# Patient Record
Sex: Female | Born: 1995 | Race: White | Hispanic: No | Marital: Single | Smoking: Former smoker
Health system: Southern US, Community
[De-identification: ages and names within clinical notes are randomized; demographics above are authoritative.]

## PROBLEM LIST (undated history)

## (undated) ENCOUNTER — Inpatient Hospital Stay (HOSPITAL_COMMUNITY): Payer: Self-pay

## (undated) DIAGNOSIS — Z789 Other specified health status: Secondary | ICD-10-CM

## (undated) HISTORY — PX: TONSILLECTOMY: SUR1361

## (undated) HISTORY — PX: ADENOIDECTOMY: SUR15

---
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
## 2017-06-05 ENCOUNTER — Other Ambulatory Visit: Payer: Self-pay

## 2017-06-05 ENCOUNTER — Emergency Department (HOSPITAL_COMMUNITY)
Admission: EM | Admit: 2017-06-05 | Discharge: 2017-06-05 | Discharge status: Against Medical Advice | Payer: BLUE CROSS/BLUE SHIELD

## 2017-08-17 ENCOUNTER — Emergency Department (HOSPITAL_COMMUNITY)
Admission: EM | Admit: 2017-08-17 | Discharge: 2017-08-18 | Disposition: A | Discharge status: Home / Self Care | Payer: Medicaid Other | Attending: Emergency Medicine | Admitting: Emergency Medicine

## 2017-08-17 ENCOUNTER — Other Ambulatory Visit: Payer: Self-pay

## 2017-08-17 ENCOUNTER — Encounter (HOSPITAL_COMMUNITY): Payer: Self-pay | Admitting: Emergency Medicine

## 2017-08-17 DIAGNOSIS — R110 Nausea: Secondary | ICD-10-CM | POA: Diagnosis not present

## 2017-08-17 DIAGNOSIS — R109 Unspecified abdominal pain: Secondary | ICD-10-CM | POA: Diagnosis present

## 2017-08-17 DIAGNOSIS — Z5321 Procedure and treatment not carried out due to patient leaving prior to being seen by health care provider: Secondary | ICD-10-CM | POA: Diagnosis not present

## 2017-08-17 DIAGNOSIS — R1030 Lower abdominal pain, unspecified: Secondary | ICD-10-CM | POA: Insufficient documentation

## 2017-08-17 NOTE — ED Triage Notes (Signed)
C/o intermittent abd cramping x 1 week with nausea.  Denies vomiting, diarrhea, and constipation.  Last BM today- normal.

## 2017-08-18 ENCOUNTER — Encounter (HOSPITAL_COMMUNITY): Payer: Self-pay | Admitting: Emergency Medicine

## 2017-08-18 ENCOUNTER — Emergency Department (HOSPITAL_COMMUNITY)
Admission: EM | Admit: 2017-08-18 | Discharge: 2017-08-19 | Disposition: A | Discharge status: Home / Self Care | Payer: Medicaid Other | Source: Home / Self Care

## 2017-08-18 DIAGNOSIS — Z5321 Procedure and treatment not carried out due to patient leaving prior to being seen by health care provider: Secondary | ICD-10-CM

## 2017-08-18 DIAGNOSIS — R1030 Lower abdominal pain, unspecified: Secondary | ICD-10-CM

## 2017-08-18 LAB — COMPREHENSIVE METABOLIC PANEL
ALK PHOS: 62 U/L (ref 38–126)
ALT: 32 U/L (ref 14–54)
ANION GAP: 8 (ref 5–15)
AST: 27 U/L (ref 15–41)
Albumin: 3.9 g/dL (ref 3.5–5.0)
BILIRUBIN TOTAL: 0.7 mg/dL (ref 0.3–1.2)
BUN: 8 mg/dL (ref 6–20)
CALCIUM: 9.2 mg/dL (ref 8.9–10.3)
CO2: 24 mmol/L (ref 22–32)
Chloride: 107 mmol/L (ref 101–111)
Creatinine, Ser: 0.8 mg/dL (ref 0.44–1.00)
GLUCOSE: 86 mg/dL (ref 65–99)
Potassium: 3.7 mmol/L (ref 3.5–5.1)
Sodium: 139 mmol/L (ref 135–145)
TOTAL PROTEIN: 6.9 g/dL (ref 6.5–8.1)

## 2017-08-18 LAB — URINALYSIS, ROUTINE W REFLEX MICROSCOPIC
BILIRUBIN URINE: NEGATIVE
Glucose, UA: NEGATIVE mg/dL
Hgb urine dipstick: NEGATIVE
Ketones, ur: NEGATIVE mg/dL
LEUKOCYTES UA: NEGATIVE
NITRITE: NEGATIVE
Protein, ur: NEGATIVE mg/dL
Specific Gravity, Urine: 1.017 (ref 1.005–1.030)
pH: 6 (ref 5.0–8.0)

## 2017-08-18 LAB — CBC
HEMATOCRIT: 43.7 % (ref 36.0–46.0)
Hemoglobin: 14.6 g/dL (ref 12.0–15.0)
MCH: 31.1 pg (ref 26.0–34.0)
MCHC: 33.4 g/dL (ref 30.0–36.0)
MCV: 93.2 fL (ref 78.0–100.0)
Platelets: 284 10*3/uL (ref 150–400)
RBC: 4.69 MIL/uL (ref 3.87–5.11)
RDW: 12.4 % (ref 11.5–15.5)
WBC: 10.1 10*3/uL (ref 4.0–10.5)

## 2017-08-18 LAB — I-STAT BETA HCG BLOOD, ED (MC, WL, AP ONLY): HCG, QUANTITATIVE: 370 m[IU]/mL — AB (ref <5)

## 2017-08-18 LAB — LIPASE, BLOOD: Lipase: 81 U/L — ABNORMAL HIGH (ref 11–51)

## 2017-08-18 NOTE — ED Triage Notes (Signed)
Reports lower abdominal pain for about a week.  Reports as not bad pain but was here last night and did not wait to be seen.  Looked at the app to see blood work and was told she should come back to get an US due to elevated HCG level.

## 2017-08-19 NOTE — ED Notes (Signed)
No response when called for vitals.  

## 2017-08-19 NOTE — ED Notes (Signed)
Still no answer after multiple attempts to recheck vitals.  To remove at this time.

## 2017-08-20 NOTE — ED Notes (Signed)
Follow up call made  No answer  08/20/17  0949 s [place] [employer] rn

## 2017-09-02 ENCOUNTER — Inpatient Hospital Stay (HOSPITAL_COMMUNITY)
Admission: AD | Admit: 2017-09-02 | Discharge: 2017-09-03 | Disposition: A | Discharge status: Home / Self Care | Payer: Medicaid Other | Source: Ambulatory Visit | Attending: Obstetrics and Gynecology | Admitting: Obstetrics and Gynecology

## 2017-09-02 ENCOUNTER — Encounter (HOSPITAL_COMMUNITY): Payer: Self-pay | Admitting: *Deleted

## 2017-09-02 ENCOUNTER — Other Ambulatory Visit: Payer: Self-pay

## 2017-09-02 ENCOUNTER — Inpatient Hospital Stay (HOSPITAL_COMMUNITY): Payer: Medicaid Other

## 2017-09-02 DIAGNOSIS — N760 Acute vaginitis: Secondary | ICD-10-CM | POA: Diagnosis not present

## 2017-09-02 DIAGNOSIS — Z87891 Personal history of nicotine dependence: Secondary | ICD-10-CM | POA: Insufficient documentation

## 2017-09-02 DIAGNOSIS — O26891 Other specified pregnancy related conditions, first trimester: Secondary | ICD-10-CM | POA: Insufficient documentation

## 2017-09-02 DIAGNOSIS — Z3491 Encounter for supervision of normal pregnancy, unspecified, first trimester: Secondary | ICD-10-CM

## 2017-09-02 DIAGNOSIS — B9689 Other specified bacterial agents as the cause of diseases classified elsewhere: Secondary | ICD-10-CM | POA: Insufficient documentation

## 2017-09-02 DIAGNOSIS — M549 Dorsalgia, unspecified: Secondary | ICD-10-CM | POA: Diagnosis present

## 2017-09-02 DIAGNOSIS — O23591 Infection of other part of genital tract in pregnancy, first trimester: Secondary | ICD-10-CM | POA: Insufficient documentation

## 2017-09-02 DIAGNOSIS — R109 Unspecified abdominal pain: Secondary | ICD-10-CM | POA: Diagnosis present

## 2017-09-02 DIAGNOSIS — S3991XA Unspecified injury of abdomen, initial encounter: Secondary | ICD-10-CM

## 2017-09-02 DIAGNOSIS — Z3A01 Less than 8 weeks gestation of pregnancy: Secondary | ICD-10-CM | POA: Diagnosis not present

## 2017-09-02 LAB — URINALYSIS, ROUTINE W REFLEX MICROSCOPIC
Bacteria, UA: NONE SEEN
Bilirubin Urine: NEGATIVE
Glucose, UA: NEGATIVE mg/dL
Hgb urine dipstick: NEGATIVE
Ketones, ur: NEGATIVE mg/dL
Nitrite: NEGATIVE
Protein, ur: NEGATIVE mg/dL
Specific Gravity, Urine: 1.023 (ref 1.005–1.030)
pH: 5 (ref 5.0–8.0)

## 2017-09-02 LAB — CBC
HCT: 42.8 % (ref 36.0–46.0)
Hemoglobin: 14.8 g/dL (ref 12.0–15.0)
MCH: 31.8 pg (ref 26.0–34.0)
MCHC: 34.6 g/dL (ref 30.0–36.0)
MCV: 91.8 fL (ref 78.0–100.0)
Platelets: 241 10*3/uL (ref 150–400)
RBC: 4.66 MIL/uL (ref 3.87–5.11)
RDW: 12.7 % (ref 11.5–15.5)
WBC: 10.3 10*3/uL (ref 4.0–10.5)

## 2017-09-02 LAB — WET PREP, GENITAL
Sperm: NONE SEEN
Trich, Wet Prep: NONE SEEN
Yeast Wet Prep HPF POC: NONE SEEN

## 2017-09-02 LAB — HCG, QUANTITATIVE, PREGNANCY: hCG, Beta Chain, Quant, S: 71244 m[IU]/mL — ABNORMAL HIGH (ref <5)

## 2017-09-02 MED ORDER — ACETAMINOPHEN 500 MG PO TABS
1000.0000 mg | ORAL_TABLET | Freq: Once | ORAL | Status: AC
  Administered 2017-09-02: 1000 mg via ORAL
  Filled 2017-09-02: qty 2

## 2017-09-02 NOTE — MAU Note (Signed)
Pt. States she fell down the steps at work today. Reports fell down 6-7 steps at 9pm. States back and abdominal pain. Back pain 7/10 and constant. Stomach pain 5/10 and also constant. Denies vaginal bleeding and discharge.

## 2017-09-02 NOTE — MAU Note (Signed)
Pt reports that she slipped in a puddle at work and fell down 6-7 stairs on her back. Pt rates pain 7/10 in her back. Denies vaginal bleeding or leaking of fluid. Has an appointment w/ pregnancy care center on Tuesday.

## 2017-09-03 DIAGNOSIS — N760 Acute vaginitis: Secondary | ICD-10-CM

## 2017-09-03 DIAGNOSIS — R109 Unspecified abdominal pain: Secondary | ICD-10-CM

## 2017-09-03 DIAGNOSIS — O26891 Other specified pregnancy related conditions, first trimester: Secondary | ICD-10-CM

## 2017-09-03 DIAGNOSIS — B9689 Other specified bacterial agents as the cause of diseases classified elsewhere: Secondary | ICD-10-CM

## 2017-09-03 LAB — ABO/RH: ABO/RH(D): O POS

## 2017-09-03 LAB — GC/CHLAMYDIA PROBE AMP (~~LOC~~) NOT AT ARMC
Chlamydia: NEGATIVE
Neisseria Gonorrhea: NEGATIVE

## 2017-09-03 MED ORDER — METRONIDAZOLE 500 MG PO TABS
500.0000 mg | ORAL_TABLET | Freq: Two times a day (BID) | ORAL | 0 refills | Status: DC
Start: 2017-09-03 — End: 2018-03-30

## 2017-09-03 NOTE — MAU Provider Note (Signed)
Chief Complaint: Back Pain and Abdominal Pain   First Provider Initiated Contact with Patient 09/02/17 2253     SUBJECTIVE HPI: Jasmine Blair is a 22 y.o. G1P0 at 4337w0d by LMP who presents to maternity admissions reporting abdominal pain and back pain. She reports symptoms started after she fell down the stairs at work. She reports slipping on a puddle at work and bouncing down 6-7 stairs hitting her butt and back. The incident occurred at 2100. She reports back pain immediately started, rates backache 7/10. She reports around 2200 her abdomen started hurting, she describes abdominal pain as lower abdominal cramping that radiates from her back to her abdomen, rates pain 5/10 and is constant. Has not taken any pain medication. She denies vaginal bleeding, vaginal itching/burning, urinary symptoms, h/a, dizziness, n/v, or fever/chills. She denies vaginal discharge. Has appointment with the pregnancy care center on Tuesday- has not been seen for care otherwise.   History reviewed. No pertinent past medical history. Past Surgical History:  Procedure Laterality Date  . ADENOIDECTOMY    . TONSILLECTOMY     Social History   Socioeconomic History  . Marital status: Single    Spouse name: Not on file  . Number of children: Not on file  . Years of education: Not on file  . Highest education level: Not on file  Occupational History  . Not on file  Social Needs  . Financial resource strain: Not on file  . Food insecurity:    Worry: Not on file    Inability: Not on file  . Transportation needs:    Medical: Not on file    Non-medical: Not on file  Tobacco Use  . Smoking status: Former Games developermoker  . Smokeless tobacco: Never Used  Substance and Sexual Activity  . Alcohol use: No  . Drug use: No  . Sexual activity: Yes  Lifestyle  . Physical activity:    Days per week: Not on file    Minutes per session: Not on file  . Stress: Not on file  Relationships  . Social connections:    Talks  on phone: Not on file    Gets together: Not on file    Attends religious service: Not on file    Active member of club or organization: Not on file    Attends meetings of clubs or organizations: Not on file    Relationship status: Not on file  . Intimate partner violence:    Fear of current or ex partner: Not on file    Emotionally abused: Not on file    Physically abused: Not on file    Forced sexual activity: Not on file  Other Topics Concern  . Not on file  Social History Narrative  . Not on file   No current facility-administered medications on file prior to encounter.    Current Outpatient Medications on File Prior to Encounter  Medication Sig Dispense Refill  . amoxicillin-clavulanate (AUGMENTIN) 875-125 MG tablet Take 1 tablet by mouth every 12 (twelve) hours. 14 tablet 0   No Known Allergies  ROS:  Review of Systems  Respiratory: Negative.   Cardiovascular: Negative.   Gastrointestinal: Positive for abdominal pain. Negative for constipation, diarrhea, nausea and vomiting.  Genitourinary: Negative.   Musculoskeletal: Positive for back pain.   I have reviewed patient's Past Medical Hx, Surgical Hx, Family Hx, Social Hx, medications and allergies.   Physical Exam   Patient Vitals for the past 24 hrs:  BP Temp Pulse Resp SpO2  09/03/17 0021 129/77 - 86 19 -  09/02/17 2232 131/86 98.4 F (36.9 C) 87 16 100 %   Constitutional: Well-developed, well-nourished female in no acute distress.  Cardiovascular: normal rate Respiratory: normal effort GI: Abd soft, non-tender. Pos BS x 4 MS: Extremities nontender, no edema, normal ROM Neurologic: Alert and oriented x 4.  PELVIC EXAM: blind cervical swabs obtained, moderate white discharge with foul odor noted at vaginal introitus while obtaining swabs.   LAB RESULTS Results for orders placed or performed during the hospital encounter of 09/02/17 (from the past 24 hour(s))  Urinalysis, Routine w reflex microscopic      Status: Abnormal   Collection Time: 09/02/17 10:52 PM  Result Value Ref Range   Color, Urine YELLOW YELLOW   APPearance HAZY (A) CLEAR   Specific Gravity, Urine 1.023 1.005 - 1.030   pH 5.0 5.0 - 8.0   Glucose, UA NEGATIVE NEGATIVE mg/dL   Hgb urine dipstick NEGATIVE NEGATIVE   Bilirubin Urine NEGATIVE NEGATIVE   Ketones, ur NEGATIVE NEGATIVE mg/dL   Protein, ur NEGATIVE NEGATIVE mg/dL   Nitrite NEGATIVE NEGATIVE   Leukocytes, UA MODERATE (A) NEGATIVE   RBC / HPF 0-5 0 - 5 RBC/hpf   WBC, UA 0-5 0 - 5 WBC/hpf   Bacteria, UA NONE SEEN NONE SEEN   Squamous Epithelial / LPF 6-10 0 - 5   Mucus PRESENT   CBC     Status: None   Collection Time: 09/02/17 11:00 PM  Result Value Ref Range   WBC 10.3 4.0 - 10.5 K/uL   RBC 4.66 3.87 - 5.11 MIL/uL   Hemoglobin 14.8 12.0 - 15.0 g/dL   HCT 16.1 09.6 - 04.5 %   MCV 91.8 78.0 - 100.0 fL   MCH 31.8 26.0 - 34.0 pg   MCHC 34.6 30.0 - 36.0 g/dL   RDW 40.9 81.1 - 91.4 %   Platelets 241 150 - 400 K/uL  ABO/Rh     Status: None   Collection Time: 09/02/17 11:00 PM  Result Value Ref Range   ABO/RH(D)      O POS Performed at [facility], [address] [place], [place] [address]   hCG, quantitative, pregnancy     Status: Abnormal   Collection Time: 09/02/17 11:00 PM  Result Value Ref Range   hCG, Beta Chain, Quant, S 71,244 (H) <5 mIU/mL  Wet prep, genital     Status: Abnormal   Collection Time: 09/02/17 11:01 PM  Result Value Ref Range   Yeast Wet Prep HPF POC NONE SEEN NONE SEEN   Trich, Wet Prep NONE SEEN NONE SEEN   Clue Cells Wet Prep HPF POC PRESENT (A) NONE SEEN   WBC, Wet Prep HPF POC MODERATE (A) NONE SEEN   Sperm NONE SEEN     --/--/O POS Performed at [facility], [address] [place], [place] [address]  [phone]/23 2300)  IMAGING US Ob Less Than 14 Weeks With Ob Transvaginal  Result Date: 09/03/2017 CLINICAL DATA:  Initial evaluation for acute pelvic and back pain status post fall. Early pregnancy. EXAM: OBSTETRIC  <14 WK [place] AND TRANSVAGINAL OB US TECHNIQUE: Both transabdominal and transvaginal ultrasound examinations were performed for complete evaluation of the gestation as well as the maternal uterus, adnexal regions, and pelvic cul-de-sac. Transvaginal technique was performed to assess early pregnancy. COMPARISON:  None. FINDINGS: Intrauterine gestational sac: Single Yolk sac:  Present Embryo:  Present Cardiac Activity: Present Heart Rate: 115 bpm CRL: 3.4 mm   5 w  6 d                  [place] EDC: 04/29/2018 Subchorionic hemorrhage:  None visualized. Maternal uterus/adnexae: Ovaries within normal limits bilaterally. No adnexal mass. No free fluid. IMPRESSION: 1. Single viable intrauterine pregnancy without complication, estimated gestational age [redacted] weeks and 6 days by crown-rump length. 2. No other acute maternal uterine or adnexal abnormality identified. Electronically Signed   By: Rise Mu M.D.   On: 09/03/2017 00:30    MAU Management/MDM: Orders Placed This Encounter  Procedures  . Wet prep, genital  . US OB LESS THAN 14 WEEKS WITH OB TRANSVAGINAL  . Urinalysis, Routine w reflex microscopic  . CBC  . hCG, quantitative, pregnancy  . ABO/Rh  . Discharge patient Discharge disposition: 01-Home or Self Care; Discharge patient date: 09/03/2017   Wet prep- positive for clue cells, will treat for BV based on clinical symptoms with odor and clue cells  UA- negative  ABO/Rh- O Pos, no Rhogam needed due to abdominal trauma   Meds ordered this encounter  Medications  . acetaminophen (TYLENOL) tablet 1,000 mg  . metroNIDAZOLE (FLAGYL) 500 MG tablet    Sig: Take 1 tablet (500 mg total) by mouth 2 (two) times daily.    Dispense:  14 tablet    Refill:  0    Order Specific Question:   Supervising Provider    Answer:   Jaynie Collins A [[id number]]    Treatments in MAU included 1000mg  Tylenol for back and abdominal pain. Pt reports decreased pain to 3/10 with medication treatment. Discussed safe medication  to take in pregnancy- list given. Lab and ultrasound results reviewed with patient.  Pt discharged. Discussed reasons to return to MAU. Patient verbalizes understanding.   ASSESSMENT 1. Normal IUP (intrauterine pregnancy) on prenatal ultrasound, first trimester   2. Abdominal trauma, initial encounter   3. Abdominal pain during pregnancy in first trimester   4. BV (bacterial vaginosis)   5. Back pain during pregnancy in first trimester     PLAN Discharge home Return to MAU as needed  Start taking prenatal vitamins  List given for OB providers in [facility] medications in pregnancy list given  Rx for Flagyl for BV  Follow up as scheduled for prenatal care   Follow-up Information    [facility] [place] MATERNITY ADMISSIONS Follow up.   Why:  Return to MAU as needed for emergencies  Contact information: [address] [id number] mc [place] [address] [phone]          Allergies as of 09/03/2017   No Known Allergies     Medication List    STOP taking these medications   amoxicillin-clavulanate 875-125 MG tablet Commonly known as:  AUGMENTIN     TAKE these medications   metroNIDAZOLE 500 MG tablet Commonly known as:  FLAGYL Take 1 tablet (500 mg total) by mouth 2 (two) times daily.       Steward Drone  Certified Nurse-Midwife 09/03/2017  12:13 AM

## 2017-10-10 LAB — OB RESULTS CONSOLE HIV ANTIBODY (ROUTINE TESTING): HIV: NONREACTIVE

## 2017-10-10 LAB — OB RESULTS CONSOLE RUBELLA ANTIBODY, IGM: Rubella: NON-IMMUNE/NOT IMMUNE

## 2017-10-10 LAB — OB RESULTS CONSOLE HEPATITIS B SURFACE ANTIGEN: Hepatitis B Surface Ag: NEGATIVE

## 2017-10-10 LAB — OB RESULTS CONSOLE RPR: RPR: NONREACTIVE

## 2017-10-11 LAB — OB RESULTS CONSOLE GC/CHLAMYDIA
Chlamydia: NEGATIVE
Gonorrhea: NEGATIVE

## 2018-01-12 ENCOUNTER — Encounter (HOSPITAL_COMMUNITY): Payer: Self-pay | Admitting: *Deleted

## 2018-01-12 ENCOUNTER — Inpatient Hospital Stay (HOSPITAL_COMMUNITY)
Admission: AD | Admit: 2018-01-12 | Discharge: 2018-01-12 | Disposition: A | Discharge status: Home / Self Care | Payer: Medicaid Other | Source: Ambulatory Visit | Attending: Obstetrics and Gynecology | Admitting: Obstetrics and Gynecology

## 2018-01-12 ENCOUNTER — Other Ambulatory Visit: Payer: Self-pay

## 2018-01-12 DIAGNOSIS — R12 Heartburn: Secondary | ICD-10-CM | POA: Diagnosis not present

## 2018-01-12 DIAGNOSIS — Z87891 Personal history of nicotine dependence: Secondary | ICD-10-CM | POA: Insufficient documentation

## 2018-01-12 DIAGNOSIS — O212 Late vomiting of pregnancy: Secondary | ICD-10-CM | POA: Diagnosis present

## 2018-01-12 DIAGNOSIS — O26892 Other specified pregnancy related conditions, second trimester: Secondary | ICD-10-CM | POA: Diagnosis not present

## 2018-01-12 DIAGNOSIS — R030 Elevated blood-pressure reading, without diagnosis of hypertension: Secondary | ICD-10-CM | POA: Insufficient documentation

## 2018-01-12 DIAGNOSIS — Z3A24 24 weeks gestation of pregnancy: Secondary | ICD-10-CM | POA: Insufficient documentation

## 2018-01-12 DIAGNOSIS — O133 Gestational [pregnancy-induced] hypertension without significant proteinuria, third trimester: Secondary | ICD-10-CM

## 2018-01-12 DIAGNOSIS — K30 Functional dyspepsia: Secondary | ICD-10-CM

## 2018-01-12 DIAGNOSIS — O219 Vomiting of pregnancy, unspecified: Secondary | ICD-10-CM

## 2018-01-12 HISTORY — DX: Other specified health status: Z78.9

## 2018-01-12 LAB — PROTEIN / CREATININE RATIO, URINE
CREATININE, URINE: 176 mg/dL
PROTEIN CREATININE RATIO: 0.07 mg/mg{creat} (ref 0.00–0.15)
TOTAL PROTEIN, URINE: 13 mg/dL

## 2018-01-12 LAB — CBC
HCT: 40.7 % (ref 36.0–46.0)
Hemoglobin: 13.6 g/dL (ref 12.0–15.0)
MCH: 31.9 pg (ref 26.0–34.0)
MCHC: 33.4 g/dL (ref 30.0–36.0)
MCV: 95.5 fL (ref 80.0–100.0)
NRBC: 0 % (ref 0.0–0.2)
PLATELETS: 253 10*3/uL (ref 150–400)
RBC: 4.26 MIL/uL (ref 3.87–5.11)
RDW: 13.8 % (ref 11.5–15.5)
WBC: 15.2 10*3/uL — ABNORMAL HIGH (ref 4.0–10.5)

## 2018-01-12 LAB — COMPREHENSIVE METABOLIC PANEL
ALT: 22 U/L (ref 0–44)
ANION GAP: 8 (ref 5–15)
AST: 18 U/L (ref 15–41)
Albumin: 3.3 g/dL — ABNORMAL LOW (ref 3.5–5.0)
Alkaline Phosphatase: 77 U/L (ref 38–126)
BUN: 7 mg/dL (ref 6–20)
CO2: 23 mmol/L (ref 22–32)
Calcium: 8.6 mg/dL — ABNORMAL LOW (ref 8.9–10.3)
Chloride: 107 mmol/L (ref 98–111)
Creatinine, Ser: 0.6 mg/dL (ref 0.44–1.00)
Glucose, Bld: 101 mg/dL — ABNORMAL HIGH (ref 70–99)
POTASSIUM: 3.9 mmol/L (ref 3.5–5.1)
SODIUM: 138 mmol/L (ref 135–145)
TOTAL PROTEIN: 7 g/dL (ref 6.5–8.1)
Total Bilirubin: 0.4 mg/dL (ref 0.3–1.2)

## 2018-01-12 MED ORDER — RANITIDINE HCL 150 MG PO TABS: 150.0000 mg | ORAL_TABLET | Freq: Two times a day (BID) | ORAL | 2 refills | Status: DC

## 2018-01-12 NOTE — MAU Note (Signed)
Pt presents to MAU c/o vomiting that started around 0530 pt states she has only vomited once but after she noticed some bleeding pt states a little larger than a golf ball (bright red). Pt was worried. No vaginal bleeding, ctxs, LOF, or vaginal discharge. Pt reports +FM.   Slight HA. No blurry vision or RUQ pain.

## 2018-01-12 NOTE — MAU Provider Note (Addendum)
Chief Complaint:  Vomiting (with blood )   First Provider Initiated Contact with Patient 01/12/18 0748      HPI: Jasmine Blair is a 22 y.o. G1P0 at [redacted]w[redacted]d who presents to maternity admissions reporting she saw blood in her emesis this morning.  She reports she ate dinner around 7 pm, had some candy around 10 or 11, then went to bed around 2 am. At 5 am, she woke up with abdominal pain in her upper abdomen. She vomiting x 1 and saw streaks of red blood in the vomit.  After vomiting her stomach pain resolved.  She called the on call nurse and was told to come to MAU to have this evaluated. She has not tried any treatments. She has heartburn daily and is taking Tums, which improve the symptoms a little.  There are no other associated symptoms. She denies abdominal pain in MAU.  She reports good fetal movement, denies LOF, vaginal bleeding, vaginal itching/burning, urinary symptoms, h/a, dizziness, or fever/chills.    HPI  Past Medical History: Past Medical History:  Diagnosis Date  . Medical history non-contributory     Past obstetric history: OB History  Gravida Para Term Preterm AB Living  1            SAB TAB Ectopic Multiple Live Births               # Outcome Date GA Lbr Len/2nd Weight Sex Delivery Anes PTL Lv  1 Current             Past Surgical History: Past Surgical History:  Procedure Laterality Date  . ADENOIDECTOMY    . TONSILLECTOMY      Family History: History reviewed. No pertinent family history.  Social History: Social History   Tobacco Use  . Smoking status: Former Games developer  . Smokeless tobacco: Never Used  Substance Use Topics  . Alcohol use: No  . Drug use: No    Allergies: No Known Allergies  Meds:  Medications Prior to Admission  Medication Sig Dispense Refill Last Dose  . Prenatal Vit-Fe Fumarate-FA (PRENATAL MULTIVITAMIN) TABS tablet Take 1 tablet by mouth daily at 12 noon.   01/12/2018 at Unknown time  . metroNIDAZOLE (FLAGYL) 500 MG tablet  Take 1 tablet (500 mg total) by mouth 2 (two) times daily. 14 tablet 0     ROS:  Review of Systems  Constitutional: Negative for chills, fatigue and fever.  Eyes: Negative for visual disturbance.  Respiratory: Negative for shortness of breath.   Cardiovascular: Negative for chest pain.  Gastrointestinal: Positive for abdominal pain and vomiting. Negative for nausea.       Blood in emesis x 1   Genitourinary: Negative for difficulty urinating, dysuria, flank pain, pelvic pain, vaginal bleeding, vaginal discharge and vaginal pain.  Neurological: Negative for dizziness and headaches.  Psychiatric/Behavioral: Negative.      I have reviewed patient's Past Medical Hx, Surgical Hx, Family Hx, Social Hx, medications and allergies.   Physical Exam   Patient Vitals for the past 24 hrs:  BP Temp Temp src Pulse Resp Height Weight  01/12/18 0745 126/86 - - 97 - - -  01/12/18 0730 133/85 - - 94 - - -  01/12/18 0716 125/90 - - 94 - - -  01/12/18 0656 (!) 134/91 98.1 F (36.7 C) Oral 96 18 5\' 2"  (1.575 m) 90.3 kg   Constitutional: Well-developed, well-nourished female in no acute distress.  Cardiovascular: normal rate Respiratory: normal effort GI: Abd soft,  non-tender, gravid appropriate for gestational age.  MS: Extremities nontender, no edema, normal ROM Neurologic: Alert and oriented x 4.  GU: Neg CVAT.     FHT:  Baseline 145 , moderate variability, accelerations present, no decelerations Contractions: None on toco or to palpation   Labs: No results found for this or any previous visit (from the past 24 hour(s)). --/--/O POS Performed at [facility], [address] [place], [place] [address]  (06/23 2300)  Imaging:  No results found.  MAU Course/MDM: NST reviewed and appropriate for gestational age with 10 x 10 accels Pt with one episode of blood in emesis, likely from acid reflux, no additional vomiting or pain. Reassurance provided to pt. Zantac 150 mg BID for  heartburn. Pt BP borderline, likely anxiety related to her symptoms but preeclampsia labs ordered to evaluate. She denies h/a, epigastric pain, or visual disturbances  Report to Thalia Bloodgood, CNM with labs pending.  Follow-up Information    [facility] [place] Follow up.   Specialty:  Obstetrics and Gynecology Contact information: [address] [place] [address] [phone]          Allergies as of 01/12/2018   No Known Allergies     Medication List    TAKE these medications   metroNIDAZOLE 500 MG tablet Commonly known as:  FLAGYL Take 1 tablet (500 mg total) by mouth 2 (two) times daily.   prenatal multivitamin Tabs tablet Take 1 tablet by mouth daily at 12 noon.   ranitidine 150 MG tablet Commonly known as:  ZANTAC Take 1 tablet (150 mg total) by mouth 2 (two) times daily.       Sharen Counter Certified Nurse-Midwife 01/12/2018 8:25 AM    Results for orders placed or performed during the hospital encounter of 01/12/18 (from the past 24 hour(s))  Protein / creatinine ratio, urine     Status: None   Collection Time: 01/12/18  7:57 AM  Result Value Ref Range   Creatinine, Urine 176.00 mg/dL   Total Protein, Urine 13 mg/dL   Protein Creatinine Ratio 0.07 0.00 - 0.15 mg/mg[Cre]  CBC     Status: Abnormal   Collection Time: 01/12/18  8:33 AM  Result Value Ref Range   WBC 15.2 (H) 4.0 - 10.5 K/uL   RBC 4.26 3.87 - 5.11 MIL/uL   Hemoglobin 13.6 12.0 - 15.0 g/dL   HCT 60.1 09.3 - 23.5 %   MCV 95.5 80.0 - 100.0 fL   MCH 31.9 26.0 - 34.0 pg   MCHC 33.4 30.0 - 36.0 g/dL   RDW 57.3 22.0 - 25.4 %   Platelets 253 150 - 400 K/uL   nRBC 0.0 0.0 - 0.2 %  Comprehensive metabolic panel     Status: Abnormal   Collection Time: 01/12/18  8:33 AM  Result Value Ref Range   Sodium 138 135 - 145 mmol/L   Potassium 3.9 3.5 - 5.1 mmol/L   Chloride 107 98 - 111 mmol/L   CO2 23 22 - 32 mmol/L   Glucose, Bld 101 (H) 70 - 99 mg/dL   BUN 7 6 - 20  mg/dL   Creatinine, Ser 2.70 0.44 - 1.00 mg/dL   Calcium 8.6 (L) 8.9 - 10.3 mg/dL   Total Protein 7.0 6.5 - 8.1 g/dL   Albumin 3.3 (L) 3.5 - 5.0 g/dL   AST 18 15 - 41 U/L   ALT 22 0 - 44 U/L   Alkaline Phosphatase 77 38 - 126 U/L  Total Bilirubin 0.4 0.3 - 1.2 mg/dL   GFR calc non Af Amer >60 >60 mL/min   GFR calc Af Amer >60 >60 mL/min   Anion gap 8 5 - 15    A/P: Elevated BP x 1 Negative preeclampsia labs Rx for Zantac to patient pharmacy by Courtney Paris, CNM Spoke with Rebbeca Paul, CNM regarding possible follow-up BP check in office Monday or Tuesday Discharge home in stable condition  Clayton Bibles, [place] 01/12/18  9:14 AM

## 2018-01-12 NOTE — Discharge Instructions (Signed)
Heartburn During Pregnancy Heartburn is pain or discomfort in the throat or chest. It may cause a burning feeling. It happens when stomach acid moves up into the tube that carries food from your mouth to your stomach (esophagus). Heartburn is common during pregnancy. It usually goes away or gets better after giving birth. Follow these instructions at home: Eating and drinking  Do not drink alcohol while you are pregnant.  Figure out which foods and beverages make you feel worse, and avoid them.  Beverages that you may want to avoid include: ? Coffee and tea (with or without caffeine). ? Energy drinks and sports drinks. ? Bubbly (carbonated) drinks or sodas. ? Citrus fruit juices.  Foods that you may want to avoid include: ? Chocolate and cocoa. ? Peppermint and mint flavorings. ? Garlic, onions, and horseradish. ? Spicy and acidic foods. These include peppers, chili powder, curry powder, vinegar, hot sauces, and barbecue sauce. ? Citrus fruits, such as oranges, lemons, and limes. ? Tomato-based foods, such as red sauce, chili, and salsa. ? Fried and fatty foods, such as donuts, french fries, potato chips, and high-fat dressings. ? High-fat meats, such as hot dogs, cold cuts, sausage, ham, and bacon. ? High-fat dairy items, such as whole milk, butter, and cheese.  Eat small meals often, instead of large meals.  Avoid drinking a lot of liquid with your meals.  Avoid eating meals during the 2-3 hours before you go to bed.  Avoid lying down right after you eat.  Do not exercise right after you eat. Medicines  Take over-the-counter and prescription medicines only as told by your doctor.  Do not take aspirin, ibuprofen, or other NSAIDs unless your doctor tells you to do that.  Your doctor may tell you to avoid medicines that have sodium bicarbonate in them. General instructions  If told, raise the head of your bed about 6 inches (15 cm). You can do this by putting blocks under  the legs. Sleeping with more pillows does not help with heartburn.  Do not use any products that contain nicotine or tobacco, such as cigarettes and e-cigarettes. If you need help quitting, ask your doctor.  Wear loose-fitting clothing.  Try to lower your stress, such as with yoga or meditation. If you need help, ask your doctor.  Stay at a healthy weight. If you are overweight, work with your doctor to safely lose weight.  Keep all follow-up visits as told by your doctor. This is important. Contact a doctor if:  You get new symptoms.  Your symptoms do not get better with treatment.  You have weight loss and you do not know why.  You have trouble swallowing.  You make loud sounds when you breathe (wheeze).  You have a cough that does not go away.  You have heartburn often for more than 2 weeks.  You feel sick to your stomach (nauseous), and this does not get better with treatment.  You are throwing up (vomiting), and this does not get better with treatment.  You have pain in your belly (abdomen). Get help right away if:  You have very bad chest pain that spreads to your arm, neck, or jaw.  You feel sweaty, dizzy, or light-headed.  You have trouble breathing.  You have pain when swallowing.  You throw up and your throw-up looks like blood or coffee grounds.  Your poop (stool) is bloody or black. This information is not intended to replace advice given to you by your health care provider.   Make sure you discuss any questions you have with your health care provider. Document Released: 04/01/2010 Document Revised: 11/15/2015 Document Reviewed: 11/15/2015 Elsevier Interactive Patient Education  2017 [employer] Inc.  

## 2018-01-16 ENCOUNTER — Other Ambulatory Visit: Payer: Self-pay | Admitting: Obstetrics and Gynecology

## 2018-03-26 ENCOUNTER — Other Ambulatory Visit: Payer: Self-pay

## 2018-03-26 ENCOUNTER — Encounter (HOSPITAL_COMMUNITY): Payer: Self-pay

## 2018-03-26 ENCOUNTER — Inpatient Hospital Stay (HOSPITAL_COMMUNITY)
Admission: AD | Admit: 2018-03-26 | Discharge: 2018-03-30 | DRG: 788 | Disposition: A | Discharge status: Home / Self Care | Payer: Medicaid Other | Attending: Obstetrics and Gynecology | Admitting: Obstetrics and Gynecology

## 2018-03-26 DIAGNOSIS — O99214 Obesity complicating childbirth: Secondary | ICD-10-CM | POA: Diagnosis present

## 2018-03-26 DIAGNOSIS — Z87891 Personal history of nicotine dependence: Secondary | ICD-10-CM

## 2018-03-26 DIAGNOSIS — O1414 Severe pre-eclampsia complicating childbirth: Principal | ICD-10-CM | POA: Diagnosis present

## 2018-03-26 DIAGNOSIS — O1413 Severe pre-eclampsia, third trimester: Secondary | ICD-10-CM | POA: Diagnosis not present

## 2018-03-26 DIAGNOSIS — O288 Other abnormal findings on antenatal screening of mother: Secondary | ICD-10-CM

## 2018-03-26 DIAGNOSIS — Z3A36 36 weeks gestation of pregnancy: Secondary | ICD-10-CM

## 2018-03-26 DIAGNOSIS — O1410 Severe pre-eclampsia, unspecified trimester: Secondary | ICD-10-CM | POA: Diagnosis present

## 2018-03-26 LAB — CBC
HCT: 40.9 % (ref 36.0–46.0)
Hemoglobin: 13.2 g/dL (ref 12.0–15.0)
MCH: 30 pg (ref 26.0–34.0)
MCHC: 32.3 g/dL (ref 30.0–36.0)
MCV: 93 fL (ref 80.0–100.0)
NRBC: 0 % (ref 0.0–0.2)
PLATELETS: 216 10*3/uL (ref 150–400)
RBC: 4.4 MIL/uL (ref 3.87–5.11)
RDW: 13.2 % (ref 11.5–15.5)
WBC: 9.8 10*3/uL (ref 4.0–10.5)

## 2018-03-26 LAB — COMPREHENSIVE METABOLIC PANEL
ALK PHOS: 149 U/L — AB (ref 38–126)
ALT: 14 U/L (ref 0–44)
AST: 22 U/L (ref 15–41)
Albumin: 2.6 g/dL — ABNORMAL LOW (ref 3.5–5.0)
Anion gap: 8 (ref 5–15)
BUN: 8 mg/dL (ref 6–20)
CALCIUM: 8.8 mg/dL — AB (ref 8.9–10.3)
CHLORIDE: 110 mmol/L (ref 98–111)
CO2: 19 mmol/L — ABNORMAL LOW (ref 22–32)
CREATININE: 0.62 mg/dL (ref 0.44–1.00)
GFR calc Af Amer: >60 mL/min (ref >60)
Glucose, Bld: 89 mg/dL (ref 70–99)
Potassium: 4.3 mmol/L (ref 3.5–5.1)
Sodium: 137 mmol/L (ref 135–145)
Total Bilirubin: 0.6 mg/dL (ref 0.3–1.2)
Total Protein: 5.8 g/dL — ABNORMAL LOW (ref 6.5–8.1)

## 2018-03-26 LAB — PROTEIN / CREATININE RATIO, URINE
Creatinine, Urine: 302 mg/dL
Protein Creatinine Ratio: 1.53 mg/mg{Cre} — ABNORMAL HIGH (ref 0.00–0.15)
Total Protein, Urine: 462 mg/dL

## 2018-03-26 LAB — TYPE AND SCREEN
ABO/RH(D): O POS
Antibody Screen: NEGATIVE

## 2018-03-26 LAB — OB RESULTS CONSOLE GBS: STREP GROUP B AG: NEGATIVE

## 2018-03-26 LAB — GROUP B STREP BY PCR: Group B strep by PCR: NEGATIVE

## 2018-03-26 MED ORDER — OXYTOCIN 40 UNITS IN NORMAL SALINE INFUSION - SIMPLE MED
2.5000 [IU]/h | INTRAVENOUS | Status: DC
  Filled 2018-03-26: qty 1000

## 2018-03-26 MED ORDER — SOD CITRATE-CITRIC ACID 500-334 MG/5ML PO SOLN
30.0000 mL | ORAL | Status: DC | PRN
  Filled 2018-03-26: qty 15

## 2018-03-26 MED ORDER — LACTATED RINGERS IV SOLN
INTRAVENOUS | Status: DC
  Administered 2018-03-26 – 2018-03-27 (×2): via INTRAVENOUS

## 2018-03-26 MED ORDER — TERBUTALINE SULFATE 1 MG/ML IJ SOLN: 0.2500 mg | Freq: Once | INTRAMUSCULAR | Status: DC | PRN

## 2018-03-26 MED ORDER — FENTANYL CITRATE (PF) 100 MCG/2ML IJ SOLN
50.0000 ug | INTRAMUSCULAR | Status: DC | PRN
  Administered 2018-03-27: 100 ug via INTRAVENOUS
  Administered 2018-03-27: 50 ug via INTRAVENOUS
  Filled 2018-03-26: qty 2

## 2018-03-26 MED ORDER — ACETAMINOPHEN 325 MG PO TABS
650.0000 mg | ORAL_TABLET | ORAL | Status: DC | PRN
  Administered 2018-03-26: 650 mg via ORAL
  Filled 2018-03-26: qty 2

## 2018-03-26 MED ORDER — HYDRALAZINE HCL 20 MG/ML IJ SOLN: 10.0000 mg | INTRAMUSCULAR | Status: DC | PRN

## 2018-03-26 MED ORDER — MAGNESIUM SULFATE 40 G IN LACTATED RINGERS - SIMPLE
2.0000 g/h | INTRAVENOUS | Status: AC
  Filled 2018-03-26 (×2): qty 500

## 2018-03-26 MED ORDER — LABETALOL HCL 5 MG/ML IV SOLN: 40.0000 mg | INTRAVENOUS | Status: DC | PRN

## 2018-03-26 MED ORDER — LABETALOL HCL 5 MG/ML IV SOLN: 80.0000 mg | INTRAVENOUS | Status: DC | PRN

## 2018-03-26 MED ORDER — LIDOCAINE HCL (PF) 1 % IJ SOLN: 30.0000 mL | INTRAMUSCULAR | Status: DC | PRN

## 2018-03-26 MED ORDER — LACTATED RINGERS IV SOLN: 500.0000 mL | INTRAVENOUS | Status: DC | PRN

## 2018-03-26 MED ORDER — LABETALOL HCL 5 MG/ML IV SOLN
40.0000 mg | INTRAVENOUS | Status: DC | PRN
  Administered 2018-03-26: 40 mg via INTRAVENOUS
  Filled 2018-03-26: qty 8

## 2018-03-26 MED ORDER — ONDANSETRON HCL 4 MG/2ML IJ SOLN
4.0000 mg | Freq: Four times a day (QID) | INTRAMUSCULAR | Status: DC | PRN
  Administered 2018-03-27: 4 mg via INTRAVENOUS
  Filled 2018-03-26: qty 2

## 2018-03-26 MED ORDER — OXYCODONE-ACETAMINOPHEN 5-325 MG PO TABS: 2.0000 | ORAL_TABLET | ORAL | Status: DC | PRN

## 2018-03-26 MED ORDER — OXYTOCIN BOLUS FROM INFUSION: 500.0000 mL | Freq: Once | INTRAVENOUS | Status: DC

## 2018-03-26 MED ORDER — MISOPROSTOL 25 MCG QUARTER TABLET
25.0000 ug | ORAL_TABLET | ORAL | Status: DC | PRN
  Administered 2018-03-26 – 2018-03-27 (×3): 25 ug via VAGINAL
  Filled 2018-03-26 (×5): qty 1

## 2018-03-26 MED ORDER — HYDRALAZINE HCL 20 MG/ML IJ SOLN: 5.0000 mg | INTRAMUSCULAR | Status: DC | PRN

## 2018-03-26 MED ORDER — BETAMETHASONE SOD PHOS & ACET 6 (3-3) MG/ML IJ SUSP
12.0000 mg | INTRAMUSCULAR | Status: DC
  Administered 2018-03-26: 12 mg via INTRAMUSCULAR
  Filled 2018-03-26: qty 2

## 2018-03-26 MED ORDER — OXYCODONE-ACETAMINOPHEN 5-325 MG PO TABS: 1.0000 | ORAL_TABLET | ORAL | Status: DC | PRN

## 2018-03-26 MED ORDER — LABETALOL HCL 5 MG/ML IV SOLN
20.0000 mg | INTRAVENOUS | Status: DC | PRN
  Administered 2018-03-26: 20 mg via INTRAVENOUS

## 2018-03-26 MED ORDER — LABETALOL HCL 5 MG/ML IV SOLN
20.0000 mg | INTRAVENOUS | Status: DC | PRN
  Administered 2018-03-26: 20 mg via INTRAVENOUS
  Filled 2018-03-26 (×2): qty 4

## 2018-03-26 MED ORDER — MAGNESIUM SULFATE BOLUS VIA INFUSION
4.0000 g | Freq: Once | INTRAVENOUS | Status: AC
  Administered 2018-03-26: 4 g via INTRAVENOUS
  Filled 2018-03-26: qty 500

## 2018-03-26 NOTE — MAU Note (Signed)
Urine in lab 

## 2018-03-26 NOTE — MAU Provider Note (Signed)
History     CSN: [id number]  Arrival date and time: 03/26/18 1158    Chief Complaint  Patient presents with  . Hypertension   G1 @36 .0 weeks sent from office for elevate BP. Denies HA, visual disturbances, RUQ pain, SOB, and CP.  Reports good FM. No VB, LOF, or ctx. States her BP is elevated when she doesn't feel well, and currently has a cold.    OB History    Gravida  1   Para      Term      Preterm      AB      Living        SAB      TAB      Ectopic      Multiple      Live Births              Past Medical History:  Diagnosis Date  . Medical history non-contributory     Past Surgical History:  Procedure Laterality Date  . ADENOIDECTOMY    . TONSILLECTOMY      History reviewed. No pertinent family history.  Social History   Tobacco Use  . Smoking status: Former Games developer  . Smokeless tobacco: Never Used  Substance Use Topics  . Alcohol use: No  . Drug use: No    Allergies: No Known Allergies  Medications Prior to Admission  Medication Sig Dispense Refill Last Dose  . metroNIDAZOLE (FLAGYL) 500 MG tablet Take 1 tablet (500 mg total) by mouth 2 (two) times daily. 14 tablet 0   . Prenatal Vit-Fe Fumarate-FA (PRENATAL MULTIVITAMIN) TABS tablet Take 1 tablet by mouth daily at 12 noon.   01/12/2018 at Unknown time  . ranitidine (ZANTAC) 150 MG tablet Take 1 tablet (150 mg total) by mouth 2 (two) times daily. 60 tablet 2     Review of Systems  Eyes: Negative for visual disturbance.  Gastrointestinal: Negative for abdominal pain.  Genitourinary: Negative for vaginal bleeding and vaginal discharge.  Neurological: Negative for headaches.   Physical Exam   Blood pressure (!) 145/101, pulse 78, temperature 97.8 F (36.6 C), temperature source Oral, resp. rate 18, weight 99.6 kg, last menstrual period 07/15/2017, SpO2 99 %. Patient Vitals for the past 24 hrs:  BP Temp Temp src Pulse Resp SpO2 Weight  03/26/18 1438 (!) 145/101 - - 78 - - -   03/26/18 1431 (!) 145/101 - - 78 - - -  03/26/18 1415 (!) 165/107 - - 80 - - -  03/26/18 1401 (!) 150/95 - - 81 - - -  03/26/18 1345 (!) 144/101 - - 81 - - -  03/26/18 1330 (!) 146/101 - - 80 - - -  03/26/18 1318 (!) 150/99 - - 90 - - -  03/26/18 1300 (!) 164/119 - - 84 - - -  03/26/18 1245 (!) 160/113 - - 81 - - -  03/26/18 1230 (!) 151/116 - - 88 - - -  03/26/18 1209 (!) 142/108 97.8 F (36.6 C) Oral 92 18 99 % 99.6 kg   Physical Exam  Constitutional: She is oriented to person, place, and time. She appears well-developed and well-nourished. No distress.  HENT:  Head: Normocephalic and atraumatic.  Neck: Normal range of motion.  Cardiovascular: Normal rate.  Respiratory: Effort normal. No respiratory distress.  Musculoskeletal: Normal range of motion.  Neurological: She is alert and oriented to person, place, and time.  Psychiatric: She has a normal mood and affect.  EFM: 145  bpm, mod variability, no accels, no decels Toco: none Bedside US: vtx  Results for orders placed or performed during the hospital encounter of 03/26/18 (from the past 24 hour(s))  Protein / creatinine ratio, urine     Status: Abnormal   Collection Time: 03/26/18 12:21 PM  Result Value Ref Range   Creatinine, Urine 302.00 mg/dL   Total Protein, Urine 462 mg/dL   Protein Creatinine Ratio 1.53 (H) 0.00 - 0.15 mg/mg[Cre]  CBC     Status: None   Collection Time: 03/26/18  1:05 PM  Result Value Ref Range   WBC 9.8 4.0 - 10.5 K/uL   RBC 4.40 3.87 - 5.11 MIL/uL   Hemoglobin 13.2 12.0 - 15.0 g/dL   HCT 37.5 43.6 - 06.7 %   MCV 93.0 80.0 - 100.0 fL   MCH 30.0 26.0 - 34.0 pg   MCHC 32.3 30.0 - 36.0 g/dL   RDW 70.3 40.3 - 52.4 %   Platelets 216 150 - 400 K/uL   nRBC 0.0 0.0 - 0.2 %  Comprehensive metabolic panel     Status: Abnormal   Collection Time: 03/26/18  1:05 PM  Result Value Ref Range   Sodium 137 135 - 145 mmol/L   Potassium 4.3 3.5 - 5.1 mmol/L   Chloride 110 98 - 111 mmol/L   CO2 19 (L) 22 -  32 mmol/L   Glucose, Bld 89 70 - 99 mg/dL   BUN 8 6 - 20 mg/dL   Creatinine, Ser 8.18 0.44 - 1.00 mg/dL   Calcium 8.8 (L) 8.9 - 10.3 mg/dL   Total Protein 5.8 (L) 6.5 - 8.1 g/dL   Albumin 2.6 (L) 3.5 - 5.0 g/dL   AST 22 15 - 41 U/L   ALT 14 0 - 44 U/L   Alkaline Phosphatase 149 (H) 38 - 126 U/L   Total Bilirubin 0.6 0.3 - 1.2 mg/dL   GFR calc non Af Amer >60 >60 mL/min   GFR calc Af Amer >60 >60 mL/min   Anion gap 8 5 - 15   MAU Course  Procedures Labetalol IV  MDM Chart review: pregnancy complicated by gHTN since 31 wks. Labs ordered and reviewed. IV Labetalol ordered for severe range BP. Meets criteria for PEC with severe features, recommend admit and IOL, discussed with Dr. Mora Appl.   Assessment and Plan  [redacted] weeks gestation Preeclampsia with severe features Admit to [facility] per Dr. Tonny Branch, CNM 03/26/2018, 2:42 PM

## 2018-03-26 NOTE — H&P (Signed)
Jasmine Blair is a 23 y.o. female, G1P0 at 6036 weeks, presenting for pre eclampsia with severe features.  Pt presented to MAU from office with increased BP.  Pt denies headache or blurred vision.  FM positive.  Pt does complain of having a viral infection. Pt prenatal hx remarkable for history of anxiety and depression.  Rubella non-immune.  Lump noted in upper inner quadrant of left breast.  Core biopsy done and negative.  Follow up 6 months.  Pt having female infant and would like an outpatient circumcision.  There are no active problems to display for this patient.   History of present pregnancy: Patient entered care at 13.1  weeks.   EDC of 04/23/2018 was established by LMP.   Anatomy scan: 21  weeks, with normal findings and an anterior  placenta.   Additional US evaluations:  Today   Significant prenatal events: none Last evaluation:  Today  OB History    Gravida  1   Para      Term      Preterm      AB      Living        SAB      TAB      Ectopic      Multiple      Live Births             Past Medical History:  Diagnosis Date  . Medical history non-contributory    Past Surgical History:  Procedure Laterality Date  . ADENOIDECTOMY    . TONSILLECTOMY     Family History: family history is not on file. Social History:  reports that she has quit smoking. She has never used smokeless tobacco. She reports that she does not drink alcohol or use drugs.   Prenatal Transfer Tool  Maternal Diabetes: No Genetic Screening: Normal Maternal Ultrasounds/Referrals: Normal Fetal Ultrasounds or other Referrals:  None Maternal Substance Abuse:  No Significant Maternal Medications:  None Significant Maternal Lab Results: None  TDAP UTD 2019 Flu Declined ROS:  All 10 systems reviewed and negative except as stated above/  No Known Allergies     Blood pressure (!) 145/101, pulse 78, temperature 97.8 F (36.6 C), temperature source Oral, resp. rate 18, weight  99.6 kg, last menstrual period 07/15/2017, SpO2 99 %.  Chest clear Heart RRR without murmur Abd gravid, NT, FH appropriate Pelvic: per RN Ext: Neg  FHR: Category FHT 145 positive variability, no decels UCs:  none  Prenatal labs: ABO, Rh: --/--/O POS Performed at [facility], [address] [place], [place] [id number]  (06/23 2300) Antibody:  Neg Rubella:   NI RPR:   NR HBsAg:   Neg HIV:   NR GBS:  Pending Sickle cell/Hgb electrophoresis: AA  GC:  Neg Chlamydia: Neg Genetic screenings: NIPT neg Glucola:  150 Other:   Hgb 15.1 at NOB, 13.2  at 28 weeks    Results for orders placed or performed during the hospital encounter of 03/26/18 (from the past 24 hour(s))  Protein / creatinine ratio, urine     Status: Abnormal   Collection Time: 03/26/18 12:21 PM  Result Value Ref Range   Creatinine, Urine 302.00 mg/dL   Total Protein, Urine 462 mg/dL   Protein Creatinine Ratio 1.53 (H) 0.00 - 0.15 mg/mg[Cre]  CBC     Status: None   Collection Time: 03/26/18  1:05 PM  Result Value Ref Range   WBC 9.8 4.0 - 10.5 K/uL   RBC 4.40 3.87 -  5.11 MIL/uL   Hemoglobin 13.2 12.0 - 15.0 g/dL   HCT 35.6 86.1 - 68.3 %   MCV 93.0 80.0 - 100.0 fL   MCH 30.0 26.0 - 34.0 pg   MCHC 32.3 30.0 - 36.0 g/dL   RDW 72.9 02.1 - 11.5 %   Platelets 216 150 - 400 K/uL   nRBC 0.0 0.0 - 0.2 %  Comprehensive metabolic panel     Status: Abnormal   Collection Time: 03/26/18  1:05 PM  Result Value Ref Range   Sodium 137 135 - 145 mmol/L   Potassium 4.3 3.5 - 5.1 mmol/L   Chloride 110 98 - 111 mmol/L   CO2 19 (L) 22 - 32 mmol/L   Glucose, Bld 89 70 - 99 mg/dL   BUN 8 6 - 20 mg/dL   Creatinine, Ser 5.20 0.44 - 1.00 mg/dL   Calcium 8.8 (L) 8.9 - 10.3 mg/dL   Total Protein 5.8 (L) 6.5 - 8.1 g/dL   Albumin 2.6 (L) 3.5 - 5.0 g/dL   AST 22 15 - 41 U/L   ALT 14 0 - 44 U/L   Alkaline Phosphatase 149 (H) 38 - 126 U/L   Total Bilirubin 0.6 0.3 - 1.2 mg/dL   GFR calc non Af Amer >60 >60 mL/min   GFR  calc Af Amer >60 >60 mL/min   Anion gap 8 5 - 15      Assessment/Plan: IUP at 36 weeks IUP with pre eclampsia with severe features induction of labor Cat 1 strip   Plan: Admit to Birthing Suite per consult with Dr. Mora Appl Routine [facility] orders Pain med/epidural prn Magnesium Sulfate 4 gm than 2 gm per hour Labetalol IV if needed. PCN G for GBS prophylaxis if positive  Henderson Newcomer ProtheroCNM, MSN 03/26/2018, 2:45 PM

## 2018-03-26 NOTE — Anesthesia Pain Management Evaluation Note (Signed)
  CRNA Pain Management Visit Note  Patient: Jasmine Blair, 23 y.o., female  "Hello I am a member of the anesthesia team at [facility]. We have an anesthesia team available at all times to provide care throughout the hospital, including epidural management and anesthesia for C-section. I don't know your plan for the delivery whether it a natural birth, water birth, IV sedation, nitrous supplementation, doula or epidural, but we want to meet your pain goals."   1.Was your pain managed to your expectations on prior hospitalizations?   No prior hospitalizations  2.What is your expectation for pain management during this hospitalization?     Epidural  3.How can we help you reach that goal?   Record the patient's initial score and the patient's pain goal.   Pain: 0  Pain Goal: 7 [facility] wants you to be able to say your pain was always managed very well.  Laban Emperor 03/26/2018

## 2018-03-26 NOTE — MAU Note (Signed)
BP high at OB appt, sent over for further eval. Denies HA, epigastric pain, visual changes or increase in swelling.  No bleeding or leaking. Denies any pain

## 2018-03-27 ENCOUNTER — Inpatient Hospital Stay (HOSPITAL_COMMUNITY): Payer: Medicaid Other | Admitting: Anesthesiology

## 2018-03-27 ENCOUNTER — Encounter (HOSPITAL_COMMUNITY)
Admission: AD | Disposition: A | Discharge status: Home / Self Care | Payer: Self-pay | Source: Home / Self Care | Attending: Obstetrics and Gynecology

## 2018-03-27 ENCOUNTER — Encounter (HOSPITAL_COMMUNITY): Payer: Self-pay | Admitting: *Deleted

## 2018-03-27 LAB — CBC WITH DIFFERENTIAL/PLATELET
Basophils Absolute: 0 K/uL (ref 0.0–0.1)
Basophils Relative: 0 %
Eosinophils Absolute: 0 K/uL (ref 0.0–0.5)
Eosinophils Relative: 0 %
HCT: 39.7 % (ref 36.0–46.0)
Hemoglobin: 13.2 g/dL (ref 12.0–15.0)
Lymphocytes Relative: 10 %
Lymphs Abs: 1.5 K/uL (ref 0.7–4.0)
MCH: 30.6 pg (ref 26.0–34.0)
MCHC: 33.2 g/dL (ref 30.0–36.0)
MCV: 91.9 fL (ref 80.0–100.0)
Monocytes Absolute: 0.4 K/uL (ref 0.1–1.0)
Monocytes Relative: 2 %
Neutro Abs: 14.1 K/uL — ABNORMAL HIGH (ref 1.7–7.7)
Neutrophils Relative %: 88 %
Platelets: 246 K/uL (ref 150–400)
RBC: 4.32 MIL/uL (ref 3.87–5.11)
RDW: 13.2 % (ref 11.5–15.5)
WBC: 16 K/uL — ABNORMAL HIGH (ref 4.0–10.5)
nRBC: 0 % (ref 0.0–0.2)

## 2018-03-27 LAB — COMPREHENSIVE METABOLIC PANEL WITH GFR
ALT: 22 U/L (ref 0–44)
AST: 36 U/L (ref 15–41)
Albumin: 2.6 g/dL — ABNORMAL LOW (ref 3.5–5.0)
Alkaline Phosphatase: 153 U/L — ABNORMAL HIGH (ref 38–126)
Anion gap: 8 (ref 5–15)
BUN: 8 mg/dL (ref 6–20)
CO2: 19 mmol/L — ABNORMAL LOW (ref 22–32)
Calcium: 7.6 mg/dL — ABNORMAL LOW (ref 8.9–10.3)
Chloride: 108 mmol/L (ref 98–111)
Creatinine, Ser: 0.66 mg/dL (ref 0.44–1.00)
GFR calc Af Amer: >60 mL/min
GFR calc non Af Amer: >60 mL/min
Glucose, Bld: 112 mg/dL — ABNORMAL HIGH (ref 70–99)
Potassium: 4.6 mmol/L (ref 3.5–5.1)
Sodium: 135 mmol/L (ref 135–145)
Total Bilirubin: 0.4 mg/dL (ref 0.3–1.2)
Total Protein: 6 g/dL — ABNORMAL LOW (ref 6.5–8.1)

## 2018-03-27 LAB — MAGNESIUM: Magnesium: 4.5 mg/dL — ABNORMAL HIGH (ref 1.7–2.4)

## 2018-03-27 LAB — PLATELET COUNT: Platelets: 239 10*3/uL (ref 150–400)

## 2018-03-27 LAB — RPR: RPR Ser Ql: NONREACTIVE

## 2018-03-27 SURGERY — Surgical Case
Anesthesia: Epidural | Site: Abdomen | Wound class: Clean Contaminated

## 2018-03-27 MED ORDER — FENTANYL CITRATE (PF) 100 MCG/2ML IJ SOLN: 25.0000 ug | INTRAMUSCULAR | Status: DC | PRN

## 2018-03-27 MED ORDER — OXYTOCIN 40 UNITS IN NORMAL SALINE INFUSION - SIMPLE MED: 1.0000 m[IU]/min | INTRAVENOUS | Status: DC

## 2018-03-27 MED ORDER — SIMETHICONE 80 MG PO CHEW
80.0000 mg | CHEWABLE_TABLET | Freq: Three times a day (TID) | ORAL | Status: DC
  Administered 2018-03-28 – 2018-03-29 (×6): 80 mg via ORAL
  Filled 2018-03-27 (×6): qty 1

## 2018-03-27 MED ORDER — NALOXONE HCL 4 MG/10ML IJ SOLN
1.0000 ug/kg/h | INTRAVENOUS | Status: DC | PRN
  Filled 2018-03-27: qty 5

## 2018-03-27 MED ORDER — TERBUTALINE SULFATE 1 MG/ML IJ SOLN
0.2500 mg | Freq: Once | INTRAMUSCULAR | Status: AC | PRN
  Administered 2018-03-27: 0.25 mg via SUBCUTANEOUS
  Filled 2018-03-27: qty 1

## 2018-03-27 MED ORDER — LIDOCAINE HCL (PF) 1 % IJ SOLN
INTRAMUSCULAR | Status: DC | PRN
  Administered 2018-03-27 (×2): 4 mL via EPIDURAL

## 2018-03-27 MED ORDER — FENTANYL 2.5 MCG/ML BUPIVACAINE 1/10 % EPIDURAL INFUSION (WH - ANES)
14.0000 mL/h | INTRAMUSCULAR | Status: DC | PRN
  Administered 2018-03-27: 12 mL/h via EPIDURAL

## 2018-03-27 MED ORDER — LACTATED RINGERS IV SOLN
INTRAVENOUS | Status: DC
  Administered 2018-03-28: 08:00:00 via INTRAVENOUS

## 2018-03-27 MED ORDER — TETANUS-DIPHTH-ACELL PERTUSSIS 5-2.5-18.5 LF-MCG/0.5 IM SUSP: 0.5000 mL | Freq: Once | INTRAMUSCULAR | Status: DC

## 2018-03-27 MED ORDER — LACTATED RINGERS IV SOLN: 500.0000 mL | Freq: Once | INTRAVENOUS | Status: DC

## 2018-03-27 MED ORDER — PHENYLEPHRINE 40 MCG/ML (10ML) SYRINGE FOR IV PUSH (FOR BLOOD PRESSURE SUPPORT)
PREFILLED_SYRINGE | INTRAVENOUS | Status: AC
  Filled 2018-03-27: qty 10

## 2018-03-27 MED ORDER — DIPHENHYDRAMINE HCL 25 MG PO CAPS
25.0000 mg | ORAL_CAPSULE | Freq: Three times a day (TID) | ORAL | Status: DC | PRN
  Administered 2018-03-27: 25 mg via ORAL
  Filled 2018-03-27: qty 1

## 2018-03-27 MED ORDER — NALBUPHINE HCL 10 MG/ML IJ SOLN: 5.0000 mg | INTRAMUSCULAR | Status: DC | PRN

## 2018-03-27 MED ORDER — SIMETHICONE 80 MG PO CHEW
80.0000 mg | CHEWABLE_TABLET | ORAL | Status: DC
  Administered 2018-03-27 – 2018-03-29 (×3): 80 mg via ORAL
  Filled 2018-03-27 (×3): qty 1

## 2018-03-27 MED ORDER — OXYTOCIN 40 UNITS IN NORMAL SALINE INFUSION - SIMPLE MED
2.5000 [IU]/h | INTRAVENOUS | Status: AC
  Administered 2018-03-28: 2.5 [IU]/h via INTRAVENOUS
  Filled 2018-03-27: qty 1000

## 2018-03-27 MED ORDER — MENTHOL 3 MG MT LOZG: 1.0000 | LOZENGE | OROMUCOSAL | Status: DC | PRN

## 2018-03-27 MED ORDER — PRENATAL MULTIVITAMIN CH
1.0000 | ORAL_TABLET | Freq: Every day | ORAL | Status: DC
  Administered 2018-03-28 – 2018-03-29 (×2): 1 via ORAL
  Filled 2018-03-27 (×3): qty 1

## 2018-03-27 MED ORDER — OXYTOCIN 10 UNIT/ML IJ SOLN
INTRAMUSCULAR | Status: AC
  Filled 2018-03-27: qty 4

## 2018-03-27 MED ORDER — SIMETHICONE 80 MG PO CHEW: 80.0000 mg | CHEWABLE_TABLET | ORAL | Status: DC | PRN

## 2018-03-27 MED ORDER — NALBUPHINE HCL 10 MG/ML IJ SOLN: 5.0000 mg | Freq: Once | INTRAMUSCULAR | Status: DC | PRN

## 2018-03-27 MED ORDER — EPHEDRINE 5 MG/ML INJ: 10.0000 mg | INTRAVENOUS | Status: DC | PRN

## 2018-03-27 MED ORDER — MORPHINE SULFATE (PF) 0.5 MG/ML IJ SOLN
INTRAMUSCULAR | Status: AC
  Filled 2018-03-27: qty 10

## 2018-03-27 MED ORDER — PHENYLEPHRINE 40 MCG/ML (10ML) SYRINGE FOR IV PUSH (FOR BLOOD PRESSURE SUPPORT): 80.0000 ug | PREFILLED_SYRINGE | INTRAVENOUS | Status: DC | PRN

## 2018-03-27 MED ORDER — DIPHENHYDRAMINE HCL 25 MG PO CAPS
25.0000 mg | ORAL_CAPSULE | ORAL | Status: DC | PRN
  Filled 2018-03-27: qty 1

## 2018-03-27 MED ORDER — IBUPROFEN 600 MG PO TABS
600.0000 mg | ORAL_TABLET | Freq: Four times a day (QID) | ORAL | Status: DC | PRN
  Administered 2018-03-27 – 2018-03-28 (×2): 600 mg via ORAL
  Filled 2018-03-27 (×3): qty 1

## 2018-03-27 MED ORDER — MEPERIDINE HCL 25 MG/ML IJ SOLN: 6.2500 mg | INTRAMUSCULAR | Status: DC | PRN

## 2018-03-27 MED ORDER — ACETAMINOPHEN 325 MG PO TABS
650.0000 mg | ORAL_TABLET | ORAL | Status: DC | PRN
  Administered 2018-03-27 – 2018-03-28 (×2): 650 mg via ORAL
  Filled 2018-03-27 (×2): qty 2

## 2018-03-27 MED ORDER — DIBUCAINE 1 % RE OINT: 1.0000 "application " | TOPICAL_OINTMENT | RECTAL | Status: DC | PRN

## 2018-03-27 MED ORDER — CEFAZOLIN SODIUM-DEXTROSE 2-3 GM-%(50ML) IV SOLR
INTRAVENOUS | Status: DC | PRN
  Administered 2018-03-27: 2 g via INTRAVENOUS

## 2018-03-27 MED ORDER — OXYTOCIN 10 UNIT/ML IJ SOLN
INTRAVENOUS | Status: DC | PRN
  Administered 2018-03-27: 40 [IU] via INTRAVENOUS

## 2018-03-27 MED ORDER — MIDAZOLAM HCL 2 MG/2ML IJ SOLN
INTRAMUSCULAR | Status: DC | PRN
  Administered 2018-03-27: 1 mg via INTRAVENOUS

## 2018-03-27 MED ORDER — SCOPOLAMINE 1 MG/3DAYS TD PT72
MEDICATED_PATCH | TRANSDERMAL | Status: DC | PRN
  Administered 2018-03-27: 1 via TRANSDERMAL

## 2018-03-27 MED ORDER — ONDANSETRON HCL 4 MG/2ML IJ SOLN: 4.0000 mg | Freq: Three times a day (TID) | INTRAMUSCULAR | Status: DC | PRN

## 2018-03-27 MED ORDER — DIPHENHYDRAMINE HCL 25 MG PO CAPS: 25.0000 mg | ORAL_CAPSULE | Freq: Four times a day (QID) | ORAL | Status: DC | PRN

## 2018-03-27 MED ORDER — SCOPOLAMINE 1 MG/3DAYS TD PT72
MEDICATED_PATCH | TRANSDERMAL | Status: AC
  Filled 2018-03-27: qty 1

## 2018-03-27 MED ORDER — WITCH HAZEL-GLYCERIN EX PADS: 1.0000 "application " | MEDICATED_PAD | CUTANEOUS | Status: DC | PRN

## 2018-03-27 MED ORDER — MEDROXYPROGESTERONE ACETATE 150 MG/ML IM SUSP: 150.0000 mg | INTRAMUSCULAR | Status: DC | PRN

## 2018-03-27 MED ORDER — COCONUT OIL OIL: 1.0000 "application " | TOPICAL_OIL | Status: DC | PRN

## 2018-03-27 MED ORDER — DIPHENHYDRAMINE HCL 50 MG/ML IJ SOLN: 12.5000 mg | INTRAMUSCULAR | Status: DC | PRN

## 2018-03-27 MED ORDER — NALOXONE HCL 0.4 MG/ML IJ SOLN: 0.4000 mg | INTRAMUSCULAR | Status: DC | PRN

## 2018-03-27 MED ORDER — BETAMETHASONE SOD PHOS & ACET 6 (3-3) MG/ML IJ SUSP
12.0000 mg | Freq: Two times a day (BID) | INTRAMUSCULAR | Status: DC
  Administered 2018-03-27: 12 mg via INTRAMUSCULAR
  Filled 2018-03-27 (×2): qty 2

## 2018-03-27 MED ORDER — DEXAMETHASONE SODIUM PHOSPHATE 10 MG/ML IJ SOLN
INTRAMUSCULAR | Status: AC
  Filled 2018-03-27: qty 1

## 2018-03-27 MED ORDER — OXYCODONE HCL 5 MG PO TABS
5.0000 mg | ORAL_TABLET | ORAL | Status: DC | PRN
  Administered 2018-03-28 (×2): 5 mg via ORAL
  Administered 2018-03-29 – 2018-03-30 (×2): 10 mg via ORAL
  Filled 2018-03-27: qty 1
  Filled 2018-03-27: qty 2
  Filled 2018-03-27: qty 1
  Filled 2018-03-27: qty 2

## 2018-03-27 MED ORDER — FENTANYL 2.5 MCG/ML BUPIVACAINE 1/10 % EPIDURAL INFUSION (WH - ANES)
INTRAMUSCULAR | Status: AC
  Filled 2018-03-27: qty 100

## 2018-03-27 MED ORDER — SODIUM CHLORIDE 0.9 % IR SOLN
Status: DC | PRN
  Administered 2018-03-27: 1

## 2018-03-27 MED ORDER — LACTATED RINGERS IV SOLN
500.0000 mL | Freq: Once | INTRAVENOUS | Status: AC
  Administered 2018-03-27: 500 mL via INTRAVENOUS

## 2018-03-27 MED ORDER — OXYTOCIN 40 UNITS IN NORMAL SALINE INFUSION - SIMPLE MED
1.0000 m[IU]/min | INTRAVENOUS | Status: DC
  Administered 2018-03-27: 1 m[IU]/min via INTRAVENOUS

## 2018-03-27 MED ORDER — SODIUM CHLORIDE 0.9% FLUSH: 3.0000 mL | INTRAVENOUS | Status: DC | PRN

## 2018-03-27 MED ORDER — ONDANSETRON HCL 4 MG/2ML IJ SOLN
INTRAMUSCULAR | Status: AC
  Filled 2018-03-27: qty 2

## 2018-03-27 MED ORDER — FENTANYL CITRATE (PF) 100 MCG/2ML IJ SOLN
INTRAMUSCULAR | Status: AC
  Filled 2018-03-27: qty 2

## 2018-03-27 MED ORDER — DEXAMETHASONE SODIUM PHOSPHATE 10 MG/ML IJ SOLN
INTRAMUSCULAR | Status: DC | PRN
  Administered 2018-03-27: 10 mg via INTRAVENOUS

## 2018-03-27 MED ORDER — SENNOSIDES-DOCUSATE SODIUM 8.6-50 MG PO TABS
2.0000 | ORAL_TABLET | ORAL | Status: DC
  Administered 2018-03-27 – 2018-03-29 (×3): 2 via ORAL
  Filled 2018-03-27 (×3): qty 2

## 2018-03-27 MED ORDER — SODIUM BICARBONATE 8.4 % IV SOLN
INTRAVENOUS | Status: DC | PRN
  Administered 2018-03-27 (×5): 5 mL via EPIDURAL

## 2018-03-27 MED ORDER — MIDAZOLAM HCL 2 MG/2ML IJ SOLN
INTRAMUSCULAR | Status: AC
  Filled 2018-03-27: qty 2

## 2018-03-27 MED ORDER — ZOLPIDEM TARTRATE 5 MG PO TABS: 5.0000 mg | ORAL_TABLET | Freq: Every evening | ORAL | Status: DC | PRN

## 2018-03-27 MED ORDER — MORPHINE SULFATE (PF) 0.5 MG/ML IJ SOLN
INTRAMUSCULAR | Status: DC | PRN
  Administered 2018-03-27: 3 mg via EPIDURAL

## 2018-03-27 SURGICAL SUPPLY — 36 items
BENZOIN TINCTURE PRP APPL 2/3 (GAUZE/BANDAGES/DRESSINGS) ×3 IMPLANT
CHLORAPREP W/TINT 26ML (MISCELLANEOUS) ×3 IMPLANT
CLAMP CORD UMBIL (MISCELLANEOUS) IMPLANT
CLOSURE STERI STRIP 1/2 X4 (GAUZE/BANDAGES/DRESSINGS) ×2 IMPLANT
CLOTH BEACON ORANGE TIMEOUT ST (SAFETY) ×3 IMPLANT
DRSG OPSITE POSTOP 4X10 (GAUZE/BANDAGES/DRESSINGS) ×3 IMPLANT
ELECT REM PT RETURN 9FT ADLT (ELECTROSURGICAL) ×3
ELECTRODE REM PT RTRN 9FT ADLT (ELECTROSURGICAL) ×1 IMPLANT
EXTRACTOR VACUUM M CUP 4 TUBE (SUCTIONS) IMPLANT
EXTRACTOR VACUUM M CUP 4' TUBE (SUCTIONS)
GLOVE BIO SURGEON STRL SZ7.5 (GLOVE) ×3 IMPLANT
GLOVE BIOGEL PI IND STRL 7.0 (GLOVE) ×1 IMPLANT
GLOVE BIOGEL PI IND STRL 7.5 (GLOVE) ×1 IMPLANT
GLOVE BIOGEL PI INDICATOR 7.0 (GLOVE) ×2
GLOVE BIOGEL PI INDICATOR 7.5 (GLOVE) ×2
GOWN STRL REUS W/TWL LRG LVL3 (GOWN DISPOSABLE) ×6 IMPLANT
KIT ABG SYR 3ML LUER SLIP (SYRINGE) IMPLANT
NDL HYPO 25X5/8 SAFETYGLIDE (NEEDLE) IMPLANT
NEEDLE HYPO 25X5/8 SAFETYGLIDE (NEEDLE) IMPLANT
NS IRRIG 1000ML POUR BTL (IV SOLUTION) ×3 IMPLANT
PACK C SECTION WH (CUSTOM PROCEDURE TRAY) ×3 IMPLANT
PAD OB MATERNITY 4.3X12.25 (PERSONAL CARE ITEMS) ×3 IMPLANT
PENCIL SMOKE EVAC W/HOLSTER (ELECTROSURGICAL) ×3 IMPLANT
RTRCTR C-SECT PINK 25CM LRG (MISCELLANEOUS) ×3 IMPLANT
SPONGE LAP 18X18 RF (DISPOSABLE) ×9 IMPLANT
STRIP CLOSURE SKIN 1/2X4 (GAUZE/BANDAGES/DRESSINGS) ×2 IMPLANT
SUT CHROMIC 2 0 CT 1 (SUTURE) ×3 IMPLANT
SUT MNCRL AB 3-0 PS2 27 (SUTURE) ×3 IMPLANT
SUT PLAIN 2 0 XLH (SUTURE) ×3 IMPLANT
SUT VIC AB 0 CT1 36 (SUTURE) ×3 IMPLANT
SUT VIC AB 0 CTX 36 (SUTURE) ×8
SUT VIC AB 0 CTX36XBRD ANBCTRL (SUTURE) ×3 IMPLANT
SUT VIC AB 2-0 SH 27 (SUTURE) ×4
SUT VIC AB 2-0 SH 27XBRD (SUTURE) ×2 IMPLANT
TOWEL OR 17X24 6PK STRL BLUE (TOWEL DISPOSABLE) ×3 IMPLANT
TRAY FOLEY W/BAG SLVR 14FR LF (SET/KITS/TRAYS/PACK) ×3 IMPLANT

## 2018-03-27 NOTE — Progress Notes (Signed)
Labor Progress Note  Jasmine Blair is a 23 y.o. female, G1P0 at 8336 weeks, presenting for pre eclampsia with severe features.  Pt presented to MAU from office with increased BP.  Pt denies headache or blurred vision  Subjective: Pt resting in bed in stable condition, RN report slight variables noted around 0930, that resolved with position change. Pt comfortable with epidural placement. Family at bedside. Pt shared frustration about birthing process and being disappoint at everything that has happened. Pt currently denies HA, vision changes no RUQ pain, pt continue to be stable on magnesium.  Patient Active Problem List   Diagnosis Date Noted  . Preeclampsia, severe 03/26/2018   Objective: BP (!) 143/92   Pulse (!) 110   Temp 97.8 F (36.6 C) (Oral)   Resp 18   Ht 5\' 2"  (1.575 m)   Wt 99.6 kg   LMP 07/15/2017 (Approximate)   SpO2 98%   BMI 40.15 kg/m  I/O last 3 completed shifts: In: 3660.6 [P.O.:1840; I.V.:1820.6] Out: 1475 [Urine:1475] Total I/O In: 1048 [P.O.:40; I.V.:972; Other:36] Out: 800 [Urine:800] NST: FHR baseline 125 bpm, Variability: moderate, Accelerations:present, Decelerations:  Absent= Cat 1/Reactive CTX:  None, noted on toco Uterus gravid, soft non tender, moderate to palpate with contractions.  SVE:  Dilation: 5 Effacement (%): 80 Station: -2 Exam by:: Jasmine Blair CNM Pitocin at 6 mUn/min  Toco not picking up cxt, I educated the pt on risk and benefits of an IUPC, AROM, and FSE, pt verbalized she was giving consent to do what we felt was nescessary.   Assessment:  Pt resting in bed in stable condition, RN report slight variables noted around 0930, that resolved with position change. Pt comfortable with epidural placement. Family at bedside. Pt shared frustration about birthing process and being disappoint at everything that has happened. BMZ x2 doses They are having a baby boy and wants out pt circ. Pt currently denies HA, vision changes no RUQ pain, pt  continue to be stable on magnesium. AROM, pt tolerated well, fetus tolerated well, light meconium noted, moderate about of fluid, IUPC placed. Pt stable.   Patient Active Problem List   Diagnosis Date Noted  . Preeclampsia, severe 03/26/2018   NICHD: Category 1  Membranes:  AROM, now moderate with light meconium x 0 hrs, no s/s of infection  Induction:    Cytotec x3 @ 1634, 2050, 0114  Foley Bulb: inserted  5am out at 8am.   Pitocin - 6  Pain management:               IV pain management: x1 @ 0646  Nitrous: none             Epidural placement:  at 0850 on 1/15  GBS Negative  PreE with SF: BP 143/92, on magnesium  Intake/Output Summary (Last 24 hours) at 03/27/2018 1317 Last data filed at 03/27/2018 1200 Gross per 24 hour  Intake 4708.64 ml  Output 2275 ml  Net 2433.64 ml   Plan: Continue labor plan Continuous/intermittent monitoring Rest Frequent position changes to facilitate fetal rotation and descent. Will reassess with cervical exam at 1400 or earlier if necessary Continue pitocin per protocol Anticipate labor progression and vaginal delivery.   Md Su HiltRoberts aware of plan and verbalized agreement.   Dale DurhamJade Edris Friedt, NP-C, CNM, MSN 03/27/2018. 1:06 PM

## 2018-03-27 NOTE — Anesthesia Preprocedure Evaluation (Addendum)
Anesthesia Evaluation  Patient identified by MRN, date of birth, ID band Patient awake    Reviewed: Allergy & Precautions, NPO status , Patient's Chart, lab work & pertinent test results  Airway Mallampati: III  TM Distance: >3 FB Neck ROM: Full   Comment: Pierced tongue Dental  (+) Teeth Intact   Pulmonary former smoker,    Pulmonary exam normal breath sounds clear to auscultation       Cardiovascular hypertension, Pt. on medications Normal cardiovascular exam Rhythm:Regular Rate:Normal     Neuro/Psych negative neurological ROS  negative psych ROS   GI/Hepatic Neg liver ROS, GERD  ,  Endo/Other  Morbid obesity  Renal/GU negative Renal ROS  negative genitourinary   Musculoskeletal negative musculoskeletal ROS (+)   Abdominal (+) + obese,   Peds  Hematology negative hematology ROS (+)   Anesthesia Other Findings   Reproductive/Obstetrics (+) Pregnancy Pre eclampsia                            Anesthesia Physical Anesthesia Plan  ASA: III and emergent  Anesthesia Plan: Epidural   Post-op Pain Management:    Induction:   PONV Risk Score and Plan:   Airway Management Planned: Natural Airway  Additional Equipment:   Intra-op Plan:   Post-operative Plan:   Informed Consent: I have reviewed the patients History and Physical, chart, labs and discussed the procedure including the risks, benefits and alternatives for the proposed anesthesia with the patient or authorized representative who has indicated his/her understanding and acceptance.     Dental advisory given  Plan Discussed with: Anesthesiologist, CRNA and Surgeon  Anesthesia Plan Comments: (Urgent C/section for fetal intolerance to labor. Will use epidural for C/section. M. Malen Gauze, MD)       Anesthesia Quick Evaluation

## 2018-03-27 NOTE — Anesthesia Postprocedure Evaluation (Signed)
Anesthesia Post Note  Patient: Jasmine Blair  Procedure(s) Performed: CESAREAN SECTION (N/A Abdomen)     Patient location during evaluation: PACU Anesthesia Type: Epidural Level of consciousness: oriented and awake and alert Pain management: pain level controlled Vital Signs Assessment: post-procedure vital signs reviewed and stable Respiratory status: spontaneous breathing, respiratory function stable and nonlabored ventilation Cardiovascular status: blood pressure returned to baseline and stable Postop Assessment: no headache, no backache, no apparent nausea or vomiting, patient able to bend at knees and epidural receding Anesthetic complications: no    Last Vitals:  Vitals:   03/27/18 1520 03/27/18 1630  BP:  130/84  Pulse:  96  Resp: (!) 27 20  Temp:  36.7 C  SpO2:  97%    Last Pain:  Vitals:   03/27/18 1630  TempSrc: Oral  PainSc:    Pain Goal:                @ANFLOW60MIN (12500)  )Amika Tassin A.

## 2018-03-27 NOTE — Progress Notes (Signed)
Jasmine Blair is a 23 y.o. G1P0 at [redacted]w[redacted]d  admitted for induction of labor due to Pre-eclamptic toxemia of pregnancy..  Subjective: Checked patient and found her to be 1 cm, discussed foley bulb placement vs cytotec. Patient consented to foley bulb and it was placed. Will start low dose pitocin and stop at 53mu/min until foley falls out.   Objective: Vitals:   03/27/18 0230 03/27/18 0328 03/27/18 0411 03/27/18 0501  BP:  (!) 141/95 (!) 147/96 132/81  Pulse:  93 91 94  Resp: 19     Temp:      TempSrc:      SpO2:      Weight:      Height:        I/O last 3 completed shifts: In: 1091.7 [P.O.:640; I.V.:451.7] Out: 700 [Urine:700] Total I/O In: 2195.6 [P.O.:1200; I.V.:995.6] Out: 650 [Urine:650]  FHT:  FHR: 120s bpm, variability: moderate,  accelerations:  Present,  decelerations:  Absent UC:   irregular, uterine irritability and rare contractions SVE:   Dilation: Fingertip Effacement (%): Thick Station: -3 Exam by:: Jasmine Blair, CNM  Labs: Lab Results  Component Value Date   WBC 9.8 03/26/2018   HGB 13.2 03/26/2018   HCT 40.9 03/26/2018   MCV 93.0 03/26/2018   PLT 216 03/26/2018    Assessment / Plan: Induction of labor due to preeclampsia, progressing on cytotec  Labor: Progressing normally Preeclampsia:  on magnesium sulfate, no signs or symptoms of toxicity, intake and ouput balanced and labs stable Fetal Wellbeing:  Category I  Pain Control:  Labor support without medications I/D:  n/a Anticipated MOD:  NSVD  [name] 03/27/2018, 5:36 AM

## 2018-03-27 NOTE — Progress Notes (Addendum)
Labor Progress Note  Jasmine Blair is a 23 y.o. female, G1P0 at 5336 weeks, presenting for pre eclampsia with severe features.  Pt presented to MAU from office with increased BP.  Pt denies headache or blurred vision  Subjective: Pt started having reoccurring variable decelerations, I remained in the room for one hour while performing fetal resuscitation. Post IUPC placement cxt were noted @ 11:25, tachysystole noted with coupling of cxt, MVU were 240, prolonged decel noted to nadir of 90s, returned with maternal position change. @ 11:37 tachysystole still noted, pitocin off, fluid bolus given, FSE placed, variable decels reoccurring were noted, maternal position chang still improved fetal decels. @11 :45 prolonged cxt were noted with increase in IUPC baseline. @ 11:50 terbutaline given. Dr Su Hiltoberts consulted and aware of pt status.  Patient Active Problem List   Diagnosis Date Noted  . Preeclampsia, severe 03/26/2018   Objective: BP (!) 151/91   Pulse (!) 119   Temp 97.8 F (36.6 C) (Oral)   Resp 18   Ht 5\' 2"  (1.575 m)   Wt 99.6 kg   LMP 07/15/2017 (Approximate)   SpO2 98%   Breastfeeding Unknown   BMI 40.15 kg/m  I/O last 3 completed shifts: In: 3660.6 [P.O.:1840; I.V.:1820.6] Out: 1475 [Urine:1475] Total I/O In: 2048 [P.O.:40; I.V.:1972; Other:36] Out: 1100 [Urine:900; Blood:200] NST: FHR baseline 130 bpm, Variability: moderate, Accelerations:present, Decelerations:  Present with prolongs and variable with cxt, nadir of 90s= Cat 2/Reactive with scalp stimulation CTX:  tachysterol not with cxt coupling, see note above for more detailed info MVU: 240 IUPC: baseline increased  Uterus gravid, soft non tender, moderate to palpate with contractions.  SVE:  Dilation: 5 Effacement (%): 80 Station: -2 Exam by:: Akisha Sturgill CNM Pitocin at 6 (Turned OFF) mUn/min  FSE Placed   Assessment:  Pt resting in bed in stable condition, RN report slight variables noted around 0930, that  resolved with position change. Pt comfortable with epidural placement. Family at bedside. Pt shared frustration about birthing process and being disappoint at everything that has happened. BMZ x2 doses They are having a baby boy and wants out pt circ. Pt currently denies HA, vision changes no RUQ pain, pt continue to be stable on magnesium. AROM, pt tolerated well, fetus tolerated well, light meconium noted, moderate about of fluid, IUPC placed. Pt stable.  Prolonged decles with variables noted with tachysytole, and increase in cxt baseline, FSE placed, Terbutaline given, Dr Su Hiltoberts in room discussing CS.  Patient Active Problem List   Diagnosis Date Noted  . Preeclampsia, severe 03/26/2018   NICHD: Category 1  Membranes:  AROM, now moderate with light meconium x 0 hrs, no s/s of infection  Induction:    Cytotec x3 @ 1634, 2050, 0114  Foley Bulb: inserted  5am out at 8am.   Pitocin -was 6; stopped now.   Pain management:               IV pain management: x1 @ 0646  Nitrous: none             Epidural placement:  at 0850 on 1/15  GBS Negative  PreE with SF: BP 151/91, on magnesium  Intake/Output Summary (Last 24 hours) at 03/27/2018 1522 Last data filed at 03/27/2018 1500 Gross per 24 hour  Intake 5708.64 ml  Output 2575 ml  Net 3133.64 ml   Plan: Anticipate primary CS Continuous/intermittent monitoring   Md Su HiltRoberts aware of plan and verbalized agreement.  Dr Su Hiltoberts consulted and updated on pt status,  Dr Su Hiltoberts in room talking with pt about CS.    Jasmine DurhamJade Jerimyah Vandunk, NP-C, CNM, MSN 03/27/2018. 3:22 PM  Late entry

## 2018-03-27 NOTE — Progress Notes (Signed)
Pt requested a primary CS, Dr Su Hilt in room consenting pt. Dx fetal intolerance to labor.

## 2018-03-27 NOTE — Op Note (Signed)
Cesarean Section Procedure Note  Indications: P0 at 64 1/7wks with fetal intolerance of labor remote from delivery and in spite of the tracing improving the pt continued to want to proceed with c-section.  Pre-operative Diagnosis: 1.36 1/7wks 2.Fetal Intolerance of Labor    Post-operative Diagnosis: 1.36 1/7wks 2.Fetal Intolerance of Labor 3.DOP  Procedure: Primary Low Transverse Cesarean Section   Surgeon: Osborn Coho, MD    Assistants: Dale Tuscarora, CNM  Anesthesia: Regional  Anesthesiologist: Mal Amabile, MD   Procedure Details  The patient was taken to the operating room secondary to fetal intolerance of labor after the risks, benefits, complications, treatment options, and expected outcomes were discussed with the patient.  The patient concurred with the proposed plan, giving informed consent which was signed and witnessed. The patient was taken to Operating Room Nine/C-Section Suite, identified as [place] and the procedure verified as C-Section Delivery. A Time Out was held and the above information confirmed.  After induction of anesthesia by obtaining a spinal, the patient was prepped and draped in the usual sterile manner. A Pfannenstiel skin incision was made and carried down through the subcutaneous tissue to the underlying layer of fascia.  The fascia was incised bilaterally and extended transversely bilaterally with the Mayo scissors. Kocher clamps were placed on the inferior aspect of the fascial incision and the underlying rectus muscle was separated from the fascia. The same was done on the superior aspect of the fascial incision.  The peritoneum was identified, entered bluntly and extended manually.  An Alexis self-retaining retractor was placed.  The utero-vesical peritoneal reflection was incised transversely and the bladder flap was bluntly freed from the lower uterine segment. A low transverse uterine incision was made with the scalpel and extended  bilaterally with the bandage scissors.  The infant was delivered in vertex position without difficulty.  After the umbilical cord was clamped and cut, the infant was handed to the awaiting pediatricians.  Cord blood was obtained for evaluation.  The placenta was removed intact and appeared to be within normal limits. The uterus was cleared of all clots and debris. The uterine incision was closed with running interlocking sutures of 0 Vicryl and a second imbricating layer was performed as well.   Bilateral tubes and ovaries appeared to be within normal limits.  Good hemostasis was noted.  Copious irrigation was performed until clear.  The peritoneum was repaired with 2-0 chromic via a running suture.  The fascia was reapproximated with a running suture of 0 Vicryl. The subcutaneous tissue was reapproximated with 3 interrupted sutures of 2-0 plain.  The skin was reapproximated with a subcuticular suture of 3-0 monocryl.  Steristrips were applied.  Instrument, sponge, and needle counts were correct prior to abdominal closure and at the conclusion of the case.  The patient was awaiting transfer to the recovery room in good condition.  Findings: Live female infant with Apgars 7 at one minute and 9 at five minutes.  Normal appearing bilateral ovaries and fallopian tubes were noted.  Estimated Blood Loss:  200 ml         Drains: foley to gravity 100 cc         Total IV Fluids: 1200 ml         Specimens to Pathology: Placenta         Complications:  None; patient tolerated the procedure well.         Disposition: PACU - hemodynamically stable.  Condition: stable  Attending Attestation: I performed the procedure.

## 2018-03-27 NOTE — Progress Notes (Addendum)
Reviewed tracing with patient and remoteness from delivery.  I discussed option for c-section and initially was recommending it but pt started crying because she does not want a c-section.  Position changed (pt had been sitting up) and tracing improved with increased variability with scalp stim (there was some cervical change as well to 5cm) and no further late decelerations.  I discussed the improvement seen with position change and could hold off on c-section for now but pt is undecided.  She wants to discuss with her family so I told her I would return to find out her decision.  Pt is now requesting c-section.  R/B/A discussed with pt, pt verbalized understanding and consent signed and witnessed.

## 2018-03-27 NOTE — Progress Notes (Signed)
Remmi Mae Palas is a 23 y.o. G1P0 at [redacted]w[redacted]d  admitted for induction of labor due to Pre-eclamptic toxemia of pregnancy..  Subjective: Patient reports a cold. She reports having a mild headache all day with no relief. Patient denies other symptoms of preeclampsia. She reports contractions are 3/10 on pain scale. Discussed foley bulb as option after cytotec when placement possible.   Objective: Vitals:   03/27/18 0123 03/27/18 0200 03/27/18 0201 03/27/18 0230  BP: (!) 135/93 133/75 133/75   Pulse: 90 97 97   Resp:    19  Temp:      TempSrc:      SpO2:      Weight:      Height:        I/O last 3 completed shifts: In: 1091.7 [P.O.:640; I.V.:451.7] Out: 700 [Urine:700] Total I/O In: 620.5 [I.V.:620.5] Out: 650 [Urine:650]  FHT:  FHR: 130 bpm, variability: moderate,  accelerations:  Present,  decelerations:  Present occasional mild early and variable decels UC:   irregular, every 1-4 minutes SVE:   Dilation: Fingertip Effacement (%): Thick Station: -3 Exam by:: Ethelene Browns, CNM  Labs: Lab Results  Component Value Date   WBC 9.8 03/26/2018   HGB 13.2 03/26/2018   HCT 40.9 03/26/2018   MCV 93.0 03/26/2018   PLT 216 03/26/2018    Assessment / Plan: Induction of labor due to preeclampsia, progressing on cytotec  Labor: Progressing normally Preeclampsia:  on magnesium sulfate, no signs or symptoms of toxicity, intake and ouput balanced and labs stable Fetal Wellbeing:  Category II but overall reassuring Pain Control:  Labor support without medications I/D:  n/a Anticipated MOD:  NSVD  [name] 03/27/2018, 3:10 AM

## 2018-03-27 NOTE — Anesthesia Procedure Notes (Signed)
Epidural Patient location during procedure: OB Start time: 03/27/2018 8:53 AM End time: 03/27/2018 9:00 AM  Staffing Anesthesiologist: Mal AmabileFoster, Lloyd Ayo, MD Performed: anesthesiologist   Preanesthetic Checklist Completed: patient identified, site marked, surgical consent, pre-op evaluation, timeout performed, IV checked, risks and benefits discussed and monitors and equipment checked  Epidural Patient position: sitting Prep: site prepped and draped and DuraPrep Patient monitoring: continuous pulse ox and blood pressure Approach: midline Location: L3-L4 Injection technique: LOR air  Needle:  Needle type: Tuohy  Needle gauge: 17 G Needle length: 9 cm and 9 Needle insertion depth: 7 cm Catheter type: closed end flexible Catheter size: 19 Gauge Catheter at skin depth: 12 cm Test dose: negative and Other  Assessment Events: blood not aspirated, injection not painful, no injection resistance, negative IV test and no paresthesia  Additional Notes Patient identified. Risks and benefits discussed including failed block, incomplete  Pain control, post dural puncture headache, nerve damage, paralysis, blood pressure Changes, nausea, vomiting, reactions to medications-both toxic and allergic and post Partum back pain. All questions were answered. Patient expressed understanding and wished to proceed. Sterile technique was used throughout procedure. Epidural site was Dressed with sterile barrier dressing. No paresthesias, signs of intravascular injection Or signs of intrathecal spread were encountered.  Patient was more comfortable after the epidural was dosed. Please see RN's note for documentation of vital signs and FHR which are stable.

## 2018-03-27 NOTE — Transfer of Care (Signed)
Immediate Anesthesia Transfer of Care Note  Patient: Jasmine Blair  Procedure(s) Performed: CESAREAN SECTION (N/A Abdomen)  Patient Location: PACU  Anesthesia Type:Epidural  Level of Consciousness: awake, alert  and oriented  Airway & Oxygen Therapy: Patient Spontanous Breathing  Post-op Assessment: Report given to RN and Post -op Vital signs reviewed and stable  Post vital signs: stable  Last Vitals:  Vitals Value Taken Time  BP 144/87 03/27/2018  3:18 PM  Temp    Pulse    Resp 27 03/27/2018  3:19 PM  SpO2    Vitals shown include unvalidated device data.  Last Pain:  Vitals:   03/27/18 1207  TempSrc: Oral  PainSc: 0-No pain         Complications: No apparent anesthesia complications

## 2018-03-28 ENCOUNTER — Encounter (HOSPITAL_COMMUNITY): Payer: Self-pay | Admitting: Obstetrics and Gynecology

## 2018-03-28 LAB — CBC
HCT: 35.8 % — ABNORMAL LOW (ref 36.0–46.0)
Hemoglobin: 11.7 g/dL — ABNORMAL LOW (ref 12.0–15.0)
MCH: 30.2 pg (ref 26.0–34.0)
MCHC: 32.7 g/dL (ref 30.0–36.0)
MCV: 92.5 fL (ref 80.0–100.0)
Platelets: 265 10*3/uL (ref 150–400)
RBC: 3.87 MIL/uL (ref 3.87–5.11)
RDW: 13.6 % (ref 11.5–15.5)
WBC: 27.7 10*3/uL — ABNORMAL HIGH (ref 4.0–10.5)
nRBC: 0 % (ref 0.0–0.2)

## 2018-03-28 LAB — COMPREHENSIVE METABOLIC PANEL
ALT: 17 U/L (ref 0–44)
AST: 31 U/L (ref 15–41)
Albumin: 2.5 g/dL — ABNORMAL LOW (ref 3.5–5.0)
Alkaline Phosphatase: 118 U/L (ref 38–126)
Anion gap: 9 (ref 5–15)
BUN: 13 mg/dL (ref 6–20)
CO2: 22 mmol/L (ref 22–32)
Calcium: 6.8 mg/dL — ABNORMAL LOW (ref 8.9–10.3)
Chloride: 106 mmol/L (ref 98–111)
Creatinine, Ser: 0.72 mg/dL (ref 0.44–1.00)
GFR calc non Af Amer: >60 mL/min (ref >60)
Glucose, Bld: 99 mg/dL (ref 70–99)
Potassium: 4.3 mmol/L (ref 3.5–5.1)
Sodium: 137 mmol/L (ref 135–145)
Total Bilirubin: 0.7 mg/dL (ref 0.3–1.2)
Total Protein: 5.8 g/dL — ABNORMAL LOW (ref 6.5–8.1)

## 2018-03-28 LAB — MAGNESIUM: Magnesium: 4.8 mg/dL — ABNORMAL HIGH (ref 1.7–2.4)

## 2018-03-28 NOTE — Addendum Note (Signed)
Addendum  created 03/28/18 0914 by Elgie Congo, CRNA   Clinical Note Signed

## 2018-03-28 NOTE — Anesthesia Postprocedure Evaluation (Signed)
Anesthesia Post Note  Patient: Jasmine Blair  Procedure(s) Performed: CESAREAN SECTION (N/A Abdomen)     Patient location during evaluation: Mother Baby Anesthesia Type: Epidural Level of consciousness: awake and alert Pain management: pain level controlled Vital Signs Assessment: post-procedure vital signs reviewed and stable Respiratory status: spontaneous breathing, nonlabored ventilation and respiratory function stable Cardiovascular status: stable Postop Assessment: no headache, no backache, epidural receding, no apparent nausea or vomiting, patient able to bend at knees, able to ambulate and adequate PO intake Anesthetic complications: no    Last Vitals:  Vitals:   03/28/18 0351 03/28/18 0819  BP: 112/63 130/89  Pulse: 95 84  Resp: 17 18  Temp: 36.8 C 36.6 C  SpO2: 97% 90%    Last Pain:  Vitals:   03/28/18 0819  TempSrc: Oral  PainSc:    Pain Goal: Patients Stated Pain Goal: 3 (03/28/18 0800)              @ANFLOW60MIN (12500)  )Boss Danielsen Hristova

## 2018-03-28 NOTE — Progress Notes (Signed)
Jasmine Blair [id number]  Subjective: Postpartum Day 1: Primary C/S due to Fetal intolerance Patient up ad lib, reports no syncope or dizziness. Feeding:  Bottle. Pt has some concerns about baby being too sleepy for feeds. He is small (4lb5oz) and she is still on magnesium. We talked about ways to rouse Jasmine for feeds and reassuring blood sugars (57 03/28/18 at 0137). Jasmine Blair is undecided about contraception but is not with the FOB and plans to abstain from sex for the immediate future.  Objective: Temp:  [97.6 F (36.4 C)-98.2 F (36.8 C)] 97.9 F (36.6 C) (01/16 1132) Pulse Rate:  [79-119] 90 (01/16 1132) Resp:  [16-27] 16 (01/16 1132) BP: (112-162)/(63-92) 119/82 (01/16 1132) SpO2:  [90 %-98 %] 96 % (01/16 1132)  CBC Latest Ref Rng & Units 03/28/2018 03/27/2018 03/27/2018  WBC 4.0 - 10.5 K/uL 27.7(H) - 16.0(H)  Hemoglobin 12.0 - 15.0 g/dL 11.7(L) - 13.2  Hematocrit 36.0 - 46.0 % 35.8(L) - 39.7  Platelets 150 - 400 K/uL 265 239 246     Physical Exam:  General: alert, cooperative and no distress Lochia: appropriate Uterine Fundus: firm Abdomen:  firm Incision: Honeycomb dressing with small area odires brick colored drainage in center of dressing, 3 x 3 cm DVT Evaluation: Calf/Ankle edema is present. JP drain:   n/a  Assessment/Plan: Status post cesarean delivery, day 1. Baby boy Jasmine Blair OP circumcision Bottle feeding. Stable Continue current care. Contraception: undecided Discharge Saturday     [facility] MSN, CNM 03/28/2018, 11:46 AM

## 2018-03-29 MED ORDER — IBUPROFEN 600 MG PO TABS
600.0000 mg | ORAL_TABLET | Freq: Four times a day (QID) | ORAL | Status: DC
  Administered 2018-03-29 – 2018-03-30 (×4): 600 mg via ORAL
  Filled 2018-03-29 (×3): qty 1

## 2018-03-29 MED ORDER — ACETAMINOPHEN 325 MG PO TABS
650.0000 mg | ORAL_TABLET | Freq: Four times a day (QID) | ORAL | Status: DC
  Administered 2018-03-29 – 2018-03-30 (×4): 650 mg via ORAL
  Filled 2018-03-29 (×4): qty 2

## 2018-03-29 NOTE — Progress Notes (Signed)
Subjective: Postpartum Day 2: Cesarean Delivery fetal intolerance to labor Patient reports incisional pain, + flatus and no problems voiding.    Objective: Vital signs in last 24 hours: Temp:  [97.6 F (36.4 C)-98.3 F (36.8 C)] 98.3 F (36.8 C) (01/17 0839) Pulse Rate:  [80-93] 80 (01/17 0839) Resp:  [16-19] 18 (01/17 0839) BP: (119-141)/(80-94) 132/90 (01/17 0839) SpO2:  [96 %-99 %] 98 % (01/17 0839)  Physical Exam:  General: alert, cooperative and no distress Lochia: appropriate Uterine Fundus: firm Incision: healing well DVT Evaluation: No evidence of DVT seen on physical exam.  Recent Labs    03/27/18 0609 03/28/18 0500  HGB 13.2 11.7*  HCT 39.7 35.8*    Assessment/Plan: Status post Cesarean section. Doing well postoperatively.  Continue current care.  Henderson Newcomer Prothero 03/29/2018, 11:03 AM

## 2018-03-29 NOTE — Progress Notes (Signed)
CSW received consult due to score 10 on Edinburgh Depression Screen.    CSW met with MOB at bedside to discuss consult for edinburgh score 10. MOB was holding infant. MOB was welcoming and engaged with CSW during assessment. CSW introduced self and explained reason for consult. CSW inquired about MOB's mental health history. MOB reported that she ws diagnosed with Anxiety and Depression in 2017 after starting college. MOB reported that starting college was a big transition and she was experiencing symptoms of anxiety and depression. MOB denied any current symptoms and reported that her last symptoms were 1 1/2 years ago. CSW informed MOB that she may be more prone to postpartum depression due to mental health history, MOB verbalized understanding. CSW asked MOB how she was doing currently, MOB reported that she is fine and tired. MOB presented calm and did not demonstrate any acute mental health signs/symptoms. MOB possessed insight about her mental health history. CSW inquired about MOB's support system, MOB reported that her parents and the infant's god mother were her supports. MOB reported that she has everything she needs to care for the baby. CSW assessed for safety, MOB denied SI, HI and domestic violence.   CSW provided education regarding Baby Blues vs PMADs and provided MOB with resources for mental health follow up.  CSW encouraged MOB to evaluate her mental health throughout the postpartum period with the use of the New Mom Checklist developed by [facility]partum [employer] as well as the [place] Postnatal Depression Scale and notify a medical professional if symptoms arise.    Abundio Miu, [place] Worker [facility] Cell#: [phone]

## 2018-03-30 MED ORDER — IBUPROFEN 600 MG PO TABS: 600.0000 mg | ORAL_TABLET | Freq: Four times a day (QID) | ORAL | 0 refills | Status: DC | PRN

## 2018-03-30 MED ORDER — MEASLES, MUMPS & RUBELLA VAC IJ SOLR
0.5000 mL | Freq: Once | INTRAMUSCULAR | Status: AC
  Administered 2018-03-30: 0.5 mL via SUBCUTANEOUS
  Filled 2018-03-30: qty 0.5

## 2018-03-30 MED ORDER — HYDROCODONE-ACETAMINOPHEN 5-325 MG PO TABS: 1.0000 | ORAL_TABLET | ORAL | 0 refills | Status: AC | PRN

## 2018-03-30 NOTE — Progress Notes (Signed)
Discharge instructions given to mother, baby and me book reviewed for postpartum and post-op care. Follow-up appoint for bp in one week. Pre-eclampsia s/s reviewed

## 2018-03-30 NOTE — Plan of Care (Signed)
Patient discharged. PP depression reviewed with patinet

## 2018-03-30 NOTE — Discharge Summary (Addendum)
  Obstetric Discharge Summary Reason for Admission: induction of labor Prenatal Procedures: Preeclampsia Intrapartum Procedures: cesarean: low cervical, transverse, fetal intolerance Postpartum Procedures: none Complications-Operative and Postpartum: none Hemoglobin  Date Value Ref Range Status  03/28/2018 11.7 (L) 12.0 - 15.0 g/dL Final   HCT  Date Value Ref Range Status  03/28/2018 35.8 (L) 36.0 - 46.0 % Final   Vitals:   03/29/18 1925 03/29/18 2315 03/30/18 0340 03/30/18 0743  BP: 127/90 131/75 105/73 (!) 129/96  Pulse: (!) 106 86 75 75  Resp: 17 19 18 20   Temp: 98.4 F (36.9 C) 98.4 F (36.9 C) 98.2 F (36.8 C) 97.8 F (36.6 C)  TempSrc: Oral Oral Oral Oral  SpO2: 98% 100% 98% 97%  Weight:      Height:         Physical Exam:  General: alert, cooperative and no distress Lochia: appropriate Uterine Fundus: firm Incision: Dressing intact with same 3 x 3 cm dried drainage (brick colored) at center DVT Evaluation: No evidence of DVT seen on physical exam. Calf/Ankle edema is present.  Discharge Diagnoses: Term Pregnancy-delivered and Preelampsia  Discharge Information: Date: 03/30/2018 Activity: unrestricted Diet: routine Medications: Ibuprofen Condition: stable Instructions: Come to clinic this Wednesday for BP evaluation, Baby Love home visit next week for same.  Discharge to: home   Follow-up Information    [facility] Follow up in 1 week(s).   Specialty:  Obstetrics and Gynecology Why:  Provider visit for BP evaluation, incision check at 1 week, then 6 week visit for postpartum checkup. Contact information: [address] [place] [address] [phone]          Newborn Data: Live born female  Birth Weight: 4 lb 15.7 oz (2260 g) APGAR: 7, 9 Bottle fed OP circ  Newborn Delivery   Birth date/time:  03/27/2018 14:29:00 Delivery type:  C-Section, Low Transverse Trial of labor:   Yes C-section categorization:  Primary    Home with mother.  No plans for contraception, patient is not currently in a relationship. We discussed PMAD and baby blues (history of anxiety and depression). She has a strong support network in place and voices understanding of signs and symptoms. We talked about the importance of follow up for her blood pressure and she agrees to POC as outlined above.   Jonetta Speak 03/30/2018, 9:21 AM

## 2018-03-30 NOTE — Discharge Instructions (Signed)

## 2019-11-28 ENCOUNTER — Encounter (HOSPITAL_BASED_OUTPATIENT_CLINIC_OR_DEPARTMENT_OTHER): Payer: Self-pay | Admitting: Emergency Medicine

## 2019-11-28 ENCOUNTER — Other Ambulatory Visit: Payer: Self-pay

## 2019-11-28 ENCOUNTER — Emergency Department (HOSPITAL_BASED_OUTPATIENT_CLINIC_OR_DEPARTMENT_OTHER)
Admission: EM | Admit: 2019-11-28 | Discharge: 2019-11-28 | Disposition: A | Discharge status: Home / Self Care | Payer: No Typology Code available for payment source | Attending: Emergency Medicine | Admitting: Emergency Medicine

## 2019-11-28 ENCOUNTER — Emergency Department (HOSPITAL_BASED_OUTPATIENT_CLINIC_OR_DEPARTMENT_OTHER): Payer: No Typology Code available for payment source

## 2019-11-28 DIAGNOSIS — M549 Dorsalgia, unspecified: Secondary | ICD-10-CM | POA: Insufficient documentation

## 2019-11-28 DIAGNOSIS — R109 Unspecified abdominal pain: Secondary | ICD-10-CM | POA: Diagnosis not present

## 2019-11-28 DIAGNOSIS — S298XXA Other specified injuries of thorax, initial encounter: Secondary | ICD-10-CM

## 2019-11-28 DIAGNOSIS — R519 Headache, unspecified: Secondary | ICD-10-CM | POA: Diagnosis not present

## 2019-11-28 DIAGNOSIS — S299XXA Unspecified injury of thorax, initial encounter: Secondary | ICD-10-CM | POA: Diagnosis present

## 2019-11-28 DIAGNOSIS — S1093XA Contusion of unspecified part of neck, initial encounter: Secondary | ICD-10-CM | POA: Diagnosis not present

## 2019-11-28 DIAGNOSIS — T148XXA Other injury of unspecified body region, initial encounter: Secondary | ICD-10-CM

## 2019-11-28 DIAGNOSIS — Z87891 Personal history of nicotine dependence: Secondary | ICD-10-CM | POA: Insufficient documentation

## 2019-11-28 MED ORDER — ACETAMINOPHEN 325 MG PO TABS
650.0000 mg | ORAL_TABLET | Freq: Once | ORAL | Status: AC
  Administered 2019-11-28: 650 mg via ORAL
  Filled 2019-11-28: qty 2

## 2019-11-28 MED ORDER — BACITRACIN ZINC 500 UNIT/GM EX OINT: TOPICAL_OINTMENT | Freq: Two times a day (BID) | CUTANEOUS | Status: DC
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
## 2020-08-12 ENCOUNTER — Other Ambulatory Visit: Payer: Self-pay

## 2020-08-12 ENCOUNTER — Encounter (HOSPITAL_BASED_OUTPATIENT_CLINIC_OR_DEPARTMENT_OTHER): Payer: Self-pay | Admitting: *Deleted

## 2020-08-12 ENCOUNTER — Emergency Department (HOSPITAL_COMMUNITY)
Admission: EM | Admit: 2020-08-12 | Discharge: 2020-08-12 | Disposition: A | Discharge status: Home / Self Care | Payer: Medicaid Other | Attending: Emergency Medicine | Admitting: Emergency Medicine

## 2020-08-12 ENCOUNTER — Emergency Department (HOSPITAL_COMMUNITY): Payer: Medicaid Other

## 2020-08-12 ENCOUNTER — Encounter (HOSPITAL_COMMUNITY): Payer: Self-pay | Admitting: Emergency Medicine

## 2020-08-12 DIAGNOSIS — Z87891 Personal history of nicotine dependence: Secondary | ICD-10-CM | POA: Insufficient documentation

## 2020-08-12 DIAGNOSIS — U071 COVID-19: Secondary | ICD-10-CM | POA: Insufficient documentation

## 2020-08-12 DIAGNOSIS — R072 Precordial pain: Secondary | ICD-10-CM | POA: Diagnosis present

## 2020-08-12 DIAGNOSIS — Z28310 Unvaccinated for covid-19: Secondary | ICD-10-CM | POA: Insufficient documentation

## 2020-08-12 DIAGNOSIS — Z5321 Procedure and treatment not carried out due to patient leaving prior to being seen by health care provider: Secondary | ICD-10-CM | POA: Diagnosis not present

## 2020-08-12 DIAGNOSIS — R109 Unspecified abdominal pain: Secondary | ICD-10-CM | POA: Insufficient documentation

## 2020-08-12 LAB — BASIC METABOLIC PANEL
Anion gap: 8 (ref 5–15)
BUN: 9 mg/dL (ref 6–20)
CO2: 24 mmol/L (ref 22–32)
Calcium: 8.7 mg/dL — ABNORMAL LOW (ref 8.9–10.3)
Chloride: 106 mmol/L (ref 98–111)
Creatinine, Ser: 0.98 mg/dL (ref 0.44–1.00)
GFR, Estimated: >60 mL/min (ref >60)
Glucose, Bld: 83 mg/dL (ref 70–99)
Potassium: 3.6 mmol/L (ref 3.5–5.1)
Sodium: 138 mmol/L (ref 135–145)

## 2020-08-12 LAB — CBC
HCT: 44.3 % (ref 36.0–46.0)
Hemoglobin: 15.1 g/dL — ABNORMAL HIGH (ref 12.0–15.0)
MCH: 31.7 pg (ref 26.0–34.0)
MCHC: 34.1 g/dL (ref 30.0–36.0)
MCV: 93.1 fL (ref 80.0–100.0)
Platelets: 243 10*3/uL (ref 150–400)
RBC: 4.76 MIL/uL (ref 3.87–5.11)
RDW: 12.2 % (ref 11.5–15.5)
WBC: 6.5 10*3/uL (ref 4.0–10.5)
nRBC: 0 % (ref 0.0–0.2)

## 2020-08-12 LAB — RESP PANEL BY RT-PCR (FLU A&B, COVID) ARPGX2
Influenza A by PCR: NEGATIVE
Influenza B by PCR: NEGATIVE
SARS Coronavirus 2 by RT PCR: POSITIVE — AB

## 2020-08-12 LAB — I-STAT BETA HCG BLOOD, ED (MC, WL, AP ONLY): I-stat hCG, quantitative: <5 m[IU]/mL (ref <5)

## 2020-08-12 LAB — TROPONIN I (HIGH SENSITIVITY): Troponin I (High Sensitivity): <2 ng/L (ref <18)

## 2020-08-12 MED ORDER — ACETAMINOPHEN 325 MG PO TABS: 650.0000 mg | ORAL_TABLET | Freq: Once | ORAL | Status: DC

## 2020-08-12 MED ORDER — ACETAMINOPHEN 500 MG PO TABS
ORAL_TABLET | ORAL | Status: AC
  Filled 2020-08-12: qty 2

## 2020-08-12 MED ORDER — ACETAMINOPHEN 500 MG PO TABS
1000.0000 mg | ORAL_TABLET | Freq: Once | ORAL | Status: AC
  Administered 2020-08-12: 1000 mg via ORAL

## 2020-08-12 NOTE — ED Notes (Signed)
Vitals called x2  

## 2020-08-12 NOTE — ED Triage Notes (Addendum)
Per ems, [place] gas station. Pt reporting having a migraine x1 month where she's been taking BC powders daily. Then today pt started having mid-sternal, sharp, intermittent non-radiating cp which she became worried and called ems. Pt thinks she is about [redacted] weeks pregnant, no prenatal care. Reports heavy bleeding a couple weeks ago and thinks she may not be pregnant anymore. EMS EKG non-remarkable.

## 2020-08-12 NOTE — ED Provider Notes (Signed)
Emergency Medicine Provider Triage Evaluation Note  Jasmine Blair , a 25 y.o. female  was evaluated in triage.  Pt complains of chest pain.  Symptoms began today.  Denies any shortness of breath.  Also having some abdominal pain for the past few days.  She initially thought she was pregnant but then had vaginal bleeding a few weeks ago so now she is concerned that she is not pregnant..  Review of Systems  Positive: Chest pain Negative: Vaginal bleeding  Physical Exam  BP 137/73 (BP Location: Right Arm)   Pulse (!) 114   Temp 99.6 F (37.6 C)   Resp 16   Ht 5\' 1"  (1.549 m)   Wt 81.6 kg   SpO2 98%   BMI 34.01 kg/m  Gen:   Awake, no distress Resp:  Normal effort MSK:   Moves extremities without difficulty Other:  No signs of respiratory distress  Medical Decision Making  Medically screening exam initiated at 5:34 PM.  Appropriate orders placed.  Jasmine Blair was informed that the remainder of the evaluation will be completed by another provider, this initial triage assessment does not replace that evaluation, and the importance of remaining in the ED until their evaluation is complete.  Will order lab work and verify if pregnant   Santa Genera, PA-C 08/12/20 1735    10/12/20, MD 08/13/20 [phone]

## 2020-08-12 NOTE — ED Triage Notes (Signed)
C/o mutiple complaints, leaving [facility] ed to come here, labs , xray and ekg done, cont c/o CP

## 2020-08-13 ENCOUNTER — Encounter (HOSPITAL_BASED_OUTPATIENT_CLINIC_OR_DEPARTMENT_OTHER): Payer: Self-pay | Admitting: Emergency Medicine

## 2020-08-13 ENCOUNTER — Telehealth: Payer: Self-pay

## 2020-08-13 ENCOUNTER — Emergency Department (HOSPITAL_BASED_OUTPATIENT_CLINIC_OR_DEPARTMENT_OTHER)
Admission: EM | Admit: 2020-08-13 | Discharge: 2020-08-13 | Disposition: A | Discharge status: Home / Self Care | Payer: Medicaid Other | Source: Home / Self Care | Attending: Emergency Medicine | Admitting: Emergency Medicine

## 2020-08-13 DIAGNOSIS — U071 COVID-19: Secondary | ICD-10-CM

## 2020-08-13 MED ORDER — ACETAMINOPHEN 500 MG PO TABS: 1000.0000 mg | ORAL_TABLET | Freq: Once | ORAL | Status: DC

## 2020-08-13 NOTE — Telephone Encounter (Signed)
Called to discuss with patient about COVID-19 symptoms and the use of one of the available treatments for those with mild to moderate Covid symptoms and at a high risk of hospitalization.  Pt appears to qualify for outpatient treatment due to co-morbid conditions and/or a member of an at-risk group in accordance with the FDA Emergency Use Authorization.    Symptom onset: 08/12/20 Chest pain,cough,fever Vaccinated: Unknown Booster? Unknown Immunocompromised? No Qualifiers: HTN,Obesity NIH Criteria: Tier 2   Unable to reach pt - Unable to leave message.   Esther Hardy

## 2020-08-13 NOTE — ED Provider Notes (Signed)
[facility] EMERGENCY DEPARTMENT Provider Note   CSN: [id number] Arrival date & time: 08/12/20  2231     History Chief Complaint  Patient presents with  . Chest Pain    Jasmine Blair is a 25 y.o. female.  The history is provided by the patient.  Chest Pain Pain location:  Substernal area Pain radiates to:  Does not radiate Pain severity:  Mild Onset quality:  Gradual Timing:  Constant Progression:  Resolved Chronicity:  New Context: at rest   Relieved by:  Nothing Worsened by:  Nothing Ineffective treatments:  None tried Associated symptoms: cough and fever   Associated symptoms: no abdominal pain, no altered mental status, no anorexia, no back pain, no dizziness, no heartburn, no lower extremity edema, no orthopnea, no palpitations, no PND, no shortness of breath, no syncope, no vomiting and no weakness   Associated symptoms comment:  Congestion and was tested as symptoms started today and is covid positive  Risk factors: not female   No travel, no OCP, no leg pain. COVID symptoms started today.  Is unvaccinated      Past Medical History:  Diagnosis Date  . Medical history non-contributory     Patient Active Problem List   Diagnosis Date Noted  . Cesarean delivery delivered 03/30/2018  . Preeclampsia, severe 03/26/2018    Past Surgical History:  Procedure Laterality Date  . ADENOIDECTOMY    . CESAREAN SECTION N/A 03/27/2018   Procedure: CESAREAN SECTION;  Surgeon: Osborn Coho, MD;  Location: [facility];  Service: Obstetrics;  Laterality: N/A;  . TONSILLECTOMY       OB History    Gravida  1   Para  1   Term      Preterm  1   AB      Living  1     SAB      IAB      Ectopic      Multiple  0   Live Births  1           History reviewed. No pertinent family history.  Social History   Tobacco Use  . Smoking status: Former Games developer  . Smokeless tobacco: Never Used  Substance Use Topics  . Alcohol use: No  .  Drug use: No    Home Medications Prior to Admission medications   Medication Sig Start Date End Date Taking? Authorizing Provider  ibuprofen (ADVIL,MOTRIN) 600 MG tablet Take 1 tablet (600 mg total) by mouth every 6 (six) hours as needed for mild pain or moderate pain. 03/30/18   Hartsog, Marry Guan, CNM  Prenatal Vit-Fe Fumarate-FA (PRENATAL MULTIVITAMIN) TABS tablet Take 1 tablet by mouth daily at 12 noon.    [provider]    Allergies    Patient has no known allergies.  Review of Systems   Review of Systems  Constitutional: Positive for fever.  HENT: Positive for congestion. Negative for drooling.   Eyes: Negative for redness.  Respiratory: Positive for cough. Negative for shortness of breath.   Cardiovascular: Positive for chest pain. Negative for palpitations, orthopnea, leg swelling, syncope and PND.  Gastrointestinal: Negative for abdominal pain, anorexia, heartburn and vomiting.  Genitourinary: Negative for difficulty urinating.  Musculoskeletal: Negative for back pain.  Skin: Negative for rash.  Neurological: Negative for dizziness and weakness.  Psychiatric/Behavioral: Negative for agitation.  All other systems reviewed and are negative.   Physical Exam Updated Vital Signs BP 130/89   Pulse (!) 112   Temp  99.6 F (37.6 C) (Oral)   Resp 16   Ht 5\' 1"  (1.549 m)   Wt 81.6 kg   SpO2 100%   BMI 34.01 kg/m   Physical Exam Vitals and nursing note reviewed.  Constitutional:      General: She is not in acute distress.    Appearance: Normal appearance.  HENT:     Head: Normocephalic and atraumatic.     Nose: Congestion present.  Eyes:     Conjunctiva/sclera: Conjunctivae normal.     Pupils: Pupils are equal, round, and reactive to light.  Cardiovascular:     Rate and Rhythm: Normal rate and regular rhythm.     Pulses: Normal pulses.     Heart sounds: Normal heart sounds.  Pulmonary:     Effort: Pulmonary effort is normal.     Breath sounds: Normal  breath sounds.  Abdominal:     General: Abdomen is flat. Bowel sounds are normal.     Palpations: Abdomen is soft.     Tenderness: There is no abdominal tenderness. There is no guarding.  Musculoskeletal:        General: Normal range of motion.     Cervical back: Normal range of motion and neck supple.  Skin:    General: Skin is warm and dry.     Capillary Refill: Capillary refill takes less than 2 seconds.  Neurological:     General: No focal deficit present.     Mental Status: She is alert and oriented to person, place, and time.     Deep Tendon Reflexes: Reflexes normal.  Psychiatric:        Mood and Affect: Mood normal.        Behavior: Behavior normal.     ED Results / Procedures / Treatments   Labs (all labs ordered are listed, but only abnormal results are displayed) Labs Reviewed  RESP PANEL BY RT-PCR (FLU A&B, COVID) ARPGX2 - Abnormal; Notable for the following components:      Result Value   SARS Coronavirus 2 by RT PCR POSITIVE (*)    All other components within normal limits    EKG None  Radiology DG Chest 2 View  Result Date: 08/12/2020 CLINICAL DATA:  Chest pain EXAM: CHEST - 2 VIEW COMPARISON:  11/28/2019 FINDINGS: The heart size and mediastinal contours are within normal limits. Both lungs are clear. The visualized skeletal structures are unremarkable. IMPRESSION: Negative. Electronically Signed   By: 11/30/2019 M.D.   On: 08/12/2020 18:03    Procedures Procedures   Medications Ordered in ED Medications  acetaminophen (TYLENOL) tablet 1,000 mg (1,000 mg Oral Given 08/12/20 2249)    ED Course  I have reviewed the triage vital signs and the nursing notes.  Pertinent labs & imaging results that were available during my care of the patient were reviewed by me and considered in my medical decision making (see chart for details).    EKG and troponins at [facility], which she left.  Has ruled out for MI.  Alternate tylenol and ibuprofen.  No going out to any  place and staying away from the elderly immune suppressed and pregnant.   Strict quarantine instructions given verbally and in print.    Jasmine Blair was evaluated in Emergency Department on 08/13/2020 for the symptoms described in the history of present illness. She was evaluated in the context of the global COVID-19 pandemic, which necessitated consideration that the patient might be at risk for infection with the SARS-CoV-2 virus  that causes COVID-19. Institutional protocols and algorithms that pertain to the evaluation of patients at risk for COVID-19 are in a state of rapid change based on information released by regulatory bodies including the CDC and federal and state organizations. These policies and algorithms were followed during the patient's care in the ED.  Final Clinical Impression(s) / ED Diagnoses Return for intractable cough, coughing up blood, fevers >100.4 unrelieved by medication, shortness of breath, intractable vomiting, chest pain, shortness of breath, weakness, numbness, changes in speech, facial asymmetry, abdominal pain, passing out, Inability to tolerate liquids or food, cough, altered mental status or any concerns. No signs of systemic illness or infection. The patient is nontoxic-appearing on exam and vital signs are within normal limits.  I have reviewed the triage vital signs and the nursing notes. Pertinent labs & imaging results that were available during my care of the patient were reviewed by me and considered in my medical decision making (see chart for details). After history, exam, and medical workup I feel the patient has been appropriately medically screened and is safe for discharge home. Pertinent diagnoses were discussed with the patient. Patient was given return precautions.      Skylan Lara, MD 08/13/20 0277

## 2020-08-13 NOTE — Discharge Instructions (Signed)
Person Under Monitoring Name: [facility]  Location: [address] [place] [address]   Infection Prevention Recommendations for Individuals Confirmed to have, or Being Evaluated for, 2019 Novel Coronavirus (COVID-19) Infection Who Receive Care at Home  Individuals who are confirmed to have, or are being evaluated for, COVID-19 should follow the prevention steps below until a healthcare provider or local or state health department says they can return to normal activities.  Stay home except to get medical care You should restrict activities outside your home, except for getting medical care. Do not go to work, school, or public areas, and do not use public transportation or taxis.  Call ahead before visiting your doctor Before your medical appointment, call the healthcare provider and tell them that you have, or are being evaluated for, COVID-19 infection. This will help the healthcare provider's office take steps to keep other people from getting infected. Ask your healthcare provider to call the local or state health department.  Monitor your symptoms Seek prompt medical attention if your illness is worsening (e.g., difficulty breathing). Before going to your medical appointment, call the healthcare provider and tell them that you have, or are being evaluated for, COVID-19 infection. Ask your healthcare provider to call the local or state health department.  Wear a facemask You should wear a facemask that covers your nose and mouth when you are in the same room with other people and when you visit a healthcare provider. People who live with or visit you should also wear a facemask while they are in the same room with you.  Separate yourself from other people in your home As much as possible, you should stay in a different room from other people in your home. Also, you should use a separate bathroom, if available.  Avoid sharing household items You  should not share dishes, drinking glasses, cups, eating utensils, towels, bedding, or other items with other people in your home. After using these items, you should wash them thoroughly with soap and water.  Cover your coughs and sneezes Cover your mouth and nose with a tissue when you cough or sneeze, or you can cough or sneeze into your sleeve. Throw used tissues in a lined trash can, and immediately wash your hands with soap and water for at least 20 seconds or use an alcohol-based hand rub.  Wash your [employer] your hands often and thoroughly with soap and water for at least 20 seconds. You can use an alcohol-based hand sanitizer if soap and water are not available and if your hands are not visibly dirty. Avoid touching your eyes, nose, and mouth with unwashed hands.   Prevention Steps for Caregivers and Household Members of Individuals Confirmed to have, or Being Evaluated for, COVID-19 Infection Being Cared for in the Home  If you live with, or provide care at home for, a person confirmed to have, or being evaluated for, COVID-19 infection please follow these guidelines to prevent infection:  Follow healthcare provider's instructions Make sure that you understand and can help the patient follow any healthcare provider instructions for all care.  Provide for the patient's basic needs You should help the patient with basic needs in the home and provide support for getting groceries, prescriptions, and other personal needs.  Monitor the patient's symptoms If they are getting sicker, call his or her medical provider and tell them that the patient has, or is being evaluated for, COVID-19 infection. This will help the healthcare  provider's office take steps to keep other people from getting infected. Ask the healthcare provider to call the local or state health department.  Limit the number of people who have contact with the patient If possible, have only one caregiver for the  patient. Other household members should stay in another home or place of residence. If this is not possible, they should stay in another room, or be separated from the patient as much as possible. Use a separate bathroom, if available. Restrict visitors who do not have an essential need to be in the home.  Keep older adults, very young children, and other sick people away from the patient Keep older adults, very young children, and those who have compromised immune systems or chronic health conditions away from the patient. This includes people with chronic heart, lung, or kidney conditions, diabetes, and cancer.  Ensure good ventilation Make sure that shared spaces in the home have good air flow, such as from an air conditioner or an opened window, weather permitting.  Wash your hands often Wash your hands often and thoroughly with soap and water for at least 20 seconds. You can use an alcohol based hand sanitizer if soap and water are not available and if your hands are not visibly dirty. Avoid touching your eyes, nose, and mouth with unwashed hands. Use disposable paper towels to dry your hands. If not available, use dedicated cloth towels and replace them when they become wet.  Wear a facemask and gloves Wear a disposable facemask at all times in the room and gloves when you touch or have contact with the patient's blood, body fluids, and/or secretions or excretions, such as sweat, saliva, sputum, nasal mucus, vomit, urine, or feces.  Ensure the mask fits over your nose and mouth tightly, and do not touch it during use. Throw out disposable facemasks and gloves after using them. Do not reuse. Wash your hands immediately after removing your facemask and gloves. If your personal clothing becomes contaminated, carefully remove clothing and launder. Wash your hands after handling contaminated clothing. Place all used disposable facemasks, gloves, and other waste in a lined container before  disposing them with other household waste. Remove gloves and wash your hands immediately after handling these items.  Do not share dishes, glasses, or other household items with the patient Avoid sharing household items. You should not share dishes, drinking glasses, cups, eating utensils, towels, bedding, or other items with a patient who is confirmed to have, or being evaluated for, COVID-19 infection. After the person uses these items, you should wash them thoroughly with soap and water.  Wash laundry thoroughly Immediately remove and wash clothes or bedding that have blood, body fluids, and/or secretions or excretions, such as sweat, saliva, sputum, nasal mucus, vomit, urine, or feces, on them. Wear gloves when handling laundry from the patient. Read and follow directions on labels of laundry or clothing items and detergent. In general, wash and dry with the warmest temperatures recommended on the label.  Clean all areas the individual has used often Clean all touchable surfaces, such as counters, tabletops, doorknobs, bathroom fixtures, toilets, phones, keyboards, tablets, and bedside tables, every day. Also, clean any surfaces that may have blood, body fluids, and/or secretions or excretions on them. Wear gloves when cleaning surfaces the patient has come in contact with. Use a diluted bleach solution (e.g., dilute bleach with 1 part bleach and 10 parts water) or a household disinfectant with a label that says EPA-registered for coronaviruses. To make  a bleach solution at home, add 1 tablespoon of bleach to 1 quart (4 cups) of water. For a larger supply, add  cup of bleach to 1 gallon (16 cups) of water. Read labels of cleaning products and follow recommendations provided on product labels. Labels contain instructions for safe and effective use of the cleaning product including precautions you should take when applying the product, such as wearing gloves or eye protection and making sure you  have good ventilation during use of the product. Remove gloves and wash hands immediately after cleaning.  Monitor yourself for signs and symptoms of illness Caregivers and household members are considered close contacts, should monitor their health, and will be asked to limit movement outside of the home to the extent possible. Follow the monitoring steps for close contacts listed on the symptom monitoring form.   ? If you have additional questions, contact your local health department or call the epidemiologist on call at [phone] (available 24/7). ? This guidance is subject to change. For the most up-to-date guidance from [facility], please refer to their website: [email or web address]

## 2020-11-05 ENCOUNTER — Emergency Department (HOSPITAL_BASED_OUTPATIENT_CLINIC_OR_DEPARTMENT_OTHER): Payer: Medicaid Other

## 2020-11-05 ENCOUNTER — Encounter (HOSPITAL_BASED_OUTPATIENT_CLINIC_OR_DEPARTMENT_OTHER): Payer: Self-pay | Admitting: *Deleted

## 2020-11-05 ENCOUNTER — Other Ambulatory Visit: Payer: Self-pay

## 2020-11-05 ENCOUNTER — Emergency Department (HOSPITAL_BASED_OUTPATIENT_CLINIC_OR_DEPARTMENT_OTHER)
Admission: EM | Admit: 2020-11-05 | Discharge: 2020-11-05 | Disposition: A | Discharge status: Home / Self Care | Payer: Medicaid Other | Attending: Emergency Medicine | Admitting: Emergency Medicine

## 2020-11-05 DIAGNOSIS — X58XXXA Exposure to other specified factors, initial encounter: Secondary | ICD-10-CM | POA: Diagnosis not present

## 2020-11-05 DIAGNOSIS — Y9289 Other specified places as the place of occurrence of the external cause: Secondary | ICD-10-CM | POA: Insufficient documentation

## 2020-11-05 DIAGNOSIS — Y9367 Activity, basketball: Secondary | ICD-10-CM | POA: Insufficient documentation

## 2020-11-05 DIAGNOSIS — S62624A Displaced fracture of medial phalanx of right ring finger, initial encounter for closed fracture: Secondary | ICD-10-CM | POA: Diagnosis not present

## 2020-11-05 DIAGNOSIS — S62655A Nondisplaced fracture of medial phalanx of left ring finger, initial encounter for closed fracture: Secondary | ICD-10-CM

## 2020-11-05 DIAGNOSIS — Z87891 Personal history of nicotine dependence: Secondary | ICD-10-CM | POA: Diagnosis not present

## 2020-11-05 DIAGNOSIS — S6991XA Unspecified injury of right wrist, hand and finger(s), initial encounter: Secondary | ICD-10-CM | POA: Diagnosis present

## 2020-11-05 MED ORDER — NAPROXEN 250 MG PO TABS
500.0000 mg | ORAL_TABLET | Freq: Once | ORAL | Status: AC
  Administered 2020-11-05: 500 mg via ORAL
  Filled 2020-11-05: qty 2

## 2020-11-05 NOTE — ED Provider Notes (Signed)
[facility] Provider Note: Lowella Dell, MD, FACEP  CSN: [id number] MRN: [id number] ARRIVAL: 11/05/20 at 0158 ROOM: [facility]06/[facility]   CHIEF COMPLAINT  Finger Injury   HISTORY OF PRESENT ILLNESS  11/05/20 3:41 AM Jasmine Blair is a 25 y.o. female who injured her right ring finger (hyperextension injury) while playing basketball yesterday evening about 10 PM.  She now has swelling and ecchymosis of that finger at about the PIP joint with associated pain decreased range of motion of that joint.  She rates the pain as a 7 out of 10, worse with movement or palpation.  She rates her pain as a 7 out of 10.   Past Medical History:  Diagnosis Date   Medical history non-contributory     Past Surgical History:  Procedure Laterality Date   ADENOIDECTOMY     CESAREAN SECTION N/A 03/27/2018   Procedure: CESAREAN SECTION;  Surgeon: Osborn Coho, MD;  Location: [facility] BIRTHING SUIT[facility];  Service: Obstetrics;  Laterality: N/A;   TONSILLECTOMY      History reviewed. No pertinent family history.  Social History   Tobacco Use   Smoking status: Former   Smokeless tobacco: Never  Substance Use Topics   Alcohol use: No   Drug use: No    Prior to Admission medications   Medication Sig Start Date End Date Taking? Authorizing Provider  venlafaxine (EFFEXOR) 37.5 MG tablet Take 37.5 mg by mouth 2 (two) times daily.   Yes [provider]  Prenatal Vit-Fe Fumarate-FA (PRENATAL MULTIVITAMIN) TABS tablet Take 1 tablet by mouth daily at 12 noon.    [provider]    Allergies Patient has no known allergies.   REVIEW OF SYSTEMS  Negative except as noted here or in the History of Present Illness.   PHYSICAL EXAMINATION  Initial Vital Signs Blood pressure 125/85, pulse (!) 110, temperature 98.5 F (36.9 C), temperature source Oral, resp. rate 18, height 5\' 1"  (1.549 m), weight 79.4 kg, SpO2 99 %.  Examination General: Well-developed, well-nourished female in  no acute distress; appearance consistent with age of record HENT: normocephalic; atraumatic Eyes: Normal appearance Neck: supple Heart: regular rate and rhythm Lungs: Normal respiratory effort and excursion Abdomen: soft; nondistended Extremities: No deformity; ecchymosis, swelling and tenderness of proximal right ring finger:    Neurologic: Awake, alert and oriented; motor function intact in all extremities and symmetric; no facial droop Skin: Warm and dry Psychiatric: Normal mood and affect   RESULTS  Summary of this visit's results, reviewed and interpreted by myself:   EKG Interpretation  Date/Time:    Ventricular Rate:    PR Interval:    QRS Duration:   QT Interval:    QTC Calculation:   R Axis:     Text Interpretation:         Laboratory Studies: No results found for this or any previous visit (from the past 24 hour(s)). Imaging Studies: DG Finger Ring Right  Result Date: 11/05/2020 CLINICAL DATA:  Right ring finger injury, swelling EXAM: RIGHT RING FINGER 2+V COMPARISON:  None. FINDINGS: There is a volar plate fracture of the base of the fourth middle phalanx of the right hand with minimal displacement of the fracture fragment, near anatomic alignment, and no significant articular incongruity. Normal overall alignment. Joint spaces are preserved. Mild surrounding soft tissue swelling. IMPRESSION: Minimally displaced, anatomically aligned volar plate fracture of the right fourth digit. Electronically Signed   By: 11/07/2020 M.D.   On: 11/05/2020 02:51  ED COURSE and MDM  Nursing notes, initial and subsequent vitals signs, including pulse oximetry, reviewed and interpreted by myself.  Vitals:   11/05/20 0207 11/05/20 0209  BP:  125/85  Pulse:  (!) 110  Resp:  18  Temp:  98.5 F (36.9 C)  TempSrc:  Oral  SpO2:  99%  Weight: 79.4 kg   Height: 5\' 1"  (1.549 m)    Medications  naproxen (NAPROSYN) tablet 500 mg (has no administration in time range)   We  will splint the patient's fractured finger and refer to hand surgery for follow-up.   PROCEDURES  Procedures   ED DIAGNOSES     ICD-10-CM   1. Closed nondisplaced fracture of middle phalanx of left ring finger, initial encounter  S62.655A     2. Injury while playing basketball  Y93.67          , MD 11/05/20 [phone]

## 2020-11-05 NOTE — ED Triage Notes (Signed)
Right ring finger injury while playing basketball tonight. Swelling and bruising noted.

## 2020-12-05 ENCOUNTER — Other Ambulatory Visit: Payer: Self-pay

## 2020-12-05 ENCOUNTER — Emergency Department (HOSPITAL_COMMUNITY)
Admission: EM | Admit: 2020-12-05 | Discharge: 2020-12-05 | Disposition: A | Discharge status: Home / Self Care | Payer: Medicaid Other | Attending: Emergency Medicine | Admitting: Emergency Medicine

## 2020-12-05 ENCOUNTER — Emergency Department (HOSPITAL_COMMUNITY): Payer: Medicaid Other

## 2020-12-05 ENCOUNTER — Encounter (HOSPITAL_COMMUNITY): Payer: Self-pay

## 2020-12-05 DIAGNOSIS — S022XXA Fracture of nasal bones, initial encounter for closed fracture: Secondary | ICD-10-CM

## 2020-12-05 DIAGNOSIS — S0121XA Laceration without foreign body of nose, initial encounter: Secondary | ICD-10-CM | POA: Insufficient documentation

## 2020-12-05 DIAGNOSIS — S0990XA Unspecified injury of head, initial encounter: Secondary | ICD-10-CM

## 2020-12-05 DIAGNOSIS — Z87891 Personal history of nicotine dependence: Secondary | ICD-10-CM | POA: Diagnosis not present

## 2020-12-05 DIAGNOSIS — S0083XA Contusion of other part of head, initial encounter: Secondary | ICD-10-CM
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
## 2020-12-05 NOTE — Discharge Instructions (Addendum)
Ice your nose for 20 minutes every 2 hours while awake for the next 2 days.  Take ibuprofen 600 mg every 6 hours as needed for pain.  Return to the emergency department if you experience any new and/or concerning symptoms.
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
## 2022-03-14 ENCOUNTER — Encounter (HOSPITAL_BASED_OUTPATIENT_CLINIC_OR_DEPARTMENT_OTHER): Payer: Self-pay | Admitting: Emergency Medicine

## 2022-03-14 ENCOUNTER — Other Ambulatory Visit: Payer: Self-pay

## 2022-03-14 DIAGNOSIS — Z1152 Encounter for screening for COVID-19: Secondary | ICD-10-CM | POA: Insufficient documentation

## 2022-03-14 DIAGNOSIS — Z8616 Personal history of COVID-19: Secondary | ICD-10-CM | POA: Insufficient documentation

## 2022-03-14 DIAGNOSIS — J101 Influenza due to other identified influenza virus with other respiratory manifestations: Secondary | ICD-10-CM | POA: Insufficient documentation

## 2022-03-14 LAB — RESP PANEL BY RT-PCR (RSV, FLU A&B, COVID)  RVPGX2
Influenza A by PCR: POSITIVE — AB
Influenza B by PCR: NEGATIVE
Resp Syncytial Virus by PCR: NEGATIVE
SARS Coronavirus 2 by RT PCR: NEGATIVE

## 2022-03-14 MED ORDER — ACETAMINOPHEN 325 MG PO TABS
650.0000 mg | ORAL_TABLET | Freq: Once | ORAL | Status: AC
  Administered 2022-03-14: 650 mg via ORAL
  Filled 2022-03-14: qty 2

## 2022-03-14 NOTE — ED Triage Notes (Signed)
Pt c/o cough, HA, fatigue x 1 1/2 wks ago

## 2022-03-15 ENCOUNTER — Emergency Department (HOSPITAL_BASED_OUTPATIENT_CLINIC_OR_DEPARTMENT_OTHER)
Admission: EM | Admit: 2022-03-15 | Discharge: 2022-03-15 | Disposition: A | Discharge status: Home / Self Care | Payer: Medicaid Other | Attending: Emergency Medicine | Admitting: Emergency Medicine

## 2022-03-15 DIAGNOSIS — J111 Influenza due to unidentified influenza virus with other respiratory manifestations: Secondary | ICD-10-CM

## 2022-03-15 MED ORDER — HYDROCODONE BIT-HOMATROP MBR 5-1.5 MG/5ML PO SOLN: 5.0000 mL | Freq: Four times a day (QID) | ORAL | 0 refills | Status: DC | PRN

## 2022-03-15 NOTE — ED Provider Notes (Signed)
[facility] EMERGENCY DEPARTMENT Provider Note   CSN: [id number] Arrival date & time: 03/14/22  2130     History  Chief Complaint  Patient presents with   Cough    Jasmine Blair is a 27 y.o. female.  27 year old female who presents the ER today with a persistent cough.  Soundly she is actually been coughing for 10 or 11 days.  She had a coworker that positive for the flu a few days ago and since that time she states that her cough is got worse she has had more body aches more fatigue.  She was febrile here but did not know she had a fever at home but did feel chills.  She states her main issue is she is coughing so much.  Is productive of some white sputum but that is unchanged.  No other known sick contacts.   Cough      Home Medications Prior to Admission medications   Medication Sig Start Date End Date Taking? Authorizing Provider  HYDROcodone bit-homatropine (HYCODAN) 5-1.5 MG/5ML syrup Take 5 mLs by mouth every 6 (six) hours as needed for cough. 03/15/22  Yes Stepfanie Yott, Corene Cornea, MD  Prenatal Vit-Fe Fumarate-FA (PRENATAL MULTIVITAMIN) TABS tablet Take 1 tablet by mouth daily at 12 noon.    [provider]  traZODone (DESYREL) 50 MG tablet Take 50 mg by mouth at bedtime. 11/30/20   [provider]  venlafaxine (EFFEXOR) 37.5 MG tablet Take 37.5 mg by mouth 2 (two) times daily.    [provider]      Allergies    Patient has no known allergies.    Review of Systems   Review of Systems  Respiratory:  Positive for cough.     Physical Exam Updated Vital Signs BP 125/88 (BP Location: Right Arm)   Pulse 98   Temp 98.6 F (37 C) (Oral)   Resp 18   Ht 5\' 1"  (1.549 m)   Wt 81.6 kg   SpO2 94%   BMI 34.01 kg/m  Physical Exam Vitals and nursing note reviewed.  Constitutional:      Appearance: She is well-developed.  HENT:     Head: Normocephalic and atraumatic.     Mouth/Throat:     Mouth: Mucous membranes are moist.  Eyes:      Pupils: Pupils are equal, round, and reactive to light.  Cardiovascular:     Rate and Rhythm: Normal rate and regular rhythm.  Pulmonary:     Effort: No respiratory distress.     Breath sounds: No stridor.  Abdominal:     General: Abdomen is flat. There is no distension.  Musculoskeletal:     Cervical back: Normal range of motion.  Skin:    General: Skin is warm and dry.  Neurological:     General: No focal deficit present.     Mental Status: She is alert.     ED Results / Procedures / Treatments   Labs (all labs ordered are listed, but only abnormal results are displayed) Labs Reviewed  RESP PANEL BY RT-PCR (RSV, FLU A&B, COVID)  RVPGX2 - Abnormal; Notable for the following components:      Result Value   Influenza A by PCR POSITIVE (*)    All other components within normal limits    EKG None  Radiology No results found.  Procedures Procedures    Medications Ordered in ED Medications  acetaminophen (TYLENOL) tablet 650 mg (650 mg Oral Given 03/14/22 2218)    ED  Course/ Medical Decision Making/ A&P                           Medical Decision Making Risk OTC drugs. Prescription drug management.   Flu positive.  I suspect this is the cause of her symptoms.  She is clear lung sounds and is no respiratory distress no seen indication for x-ray with low suspicion for postviral bacterial pneumonia.  Will treat symptomatically at home.  Follow-up with PCP if not improving otherwise can return to work when she is feeling better. No indication for tamiflu  Final Clinical Impression(s) / ED Diagnoses Final diagnoses:  Influenza    Rx / DC Orders ED Discharge Orders          Ordered    HYDROcodone bit-homatropine (HYCODAN) 5-1.5 MG/5ML syrup  Every 6 hours PRN        03/15/22 0155              Larose Batres, Corene Cornea, MD 03/15/22 2761

## 2022-07-19 IMAGING — CT CT HEAD W/O CM
3 series · 15 of 47 positions shown, 18 images · non-contrast
Comparison: None.

CLINICAL DATA: Head trauma, mod-severe; Facial trauma

EXAM:
CT HEAD WITHOUT CONTRAST
CT MAXILLOFACIAL WITHOUT CONTRAST
TECHNIQUE: Multidetector CT imaging of the head and maxillofacial structures
were performed using the standard protocol without intravenous
contrast. Multiplanar CT image reconstructions of the maxillofacial
structures were also generated.

[Series 3: head wo · axial · 0.42mm/px · z∈[-160,-25]mm · 9 of 33 slices shown, 12 images]
[im 3/33  brain]
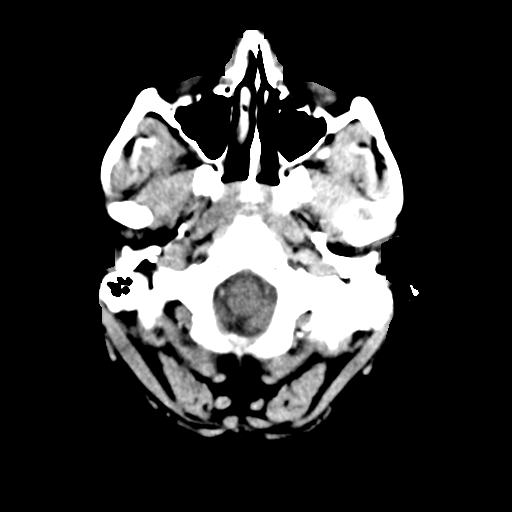
[im 3/33  bone]
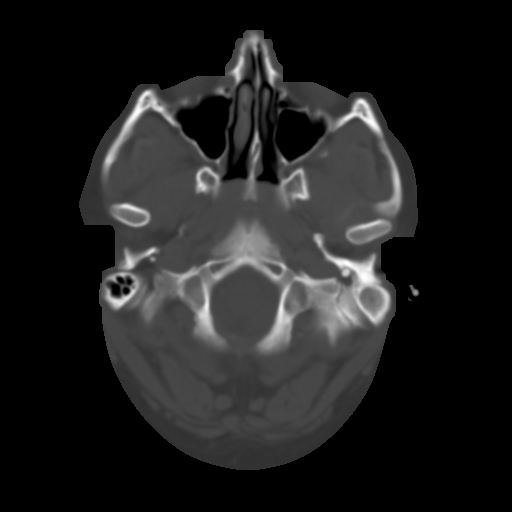
[im 6/33  brain]
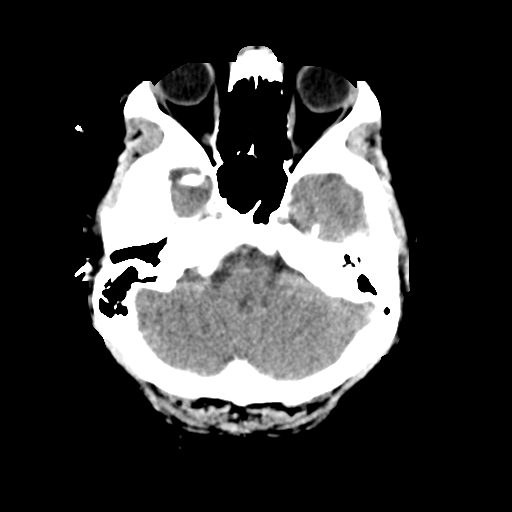
[im 9/33  brain]
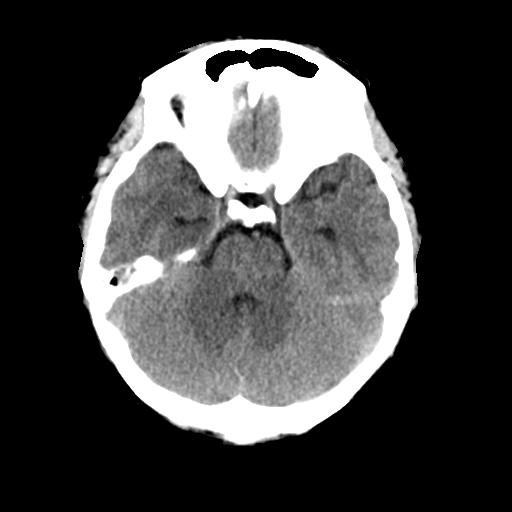
[im 13/33  brain]
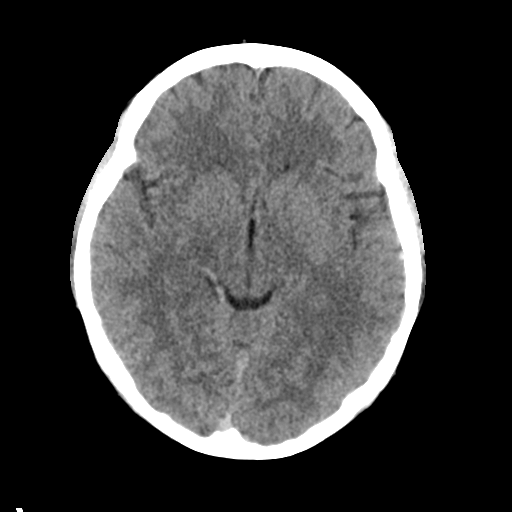
[im 17/33  brain]
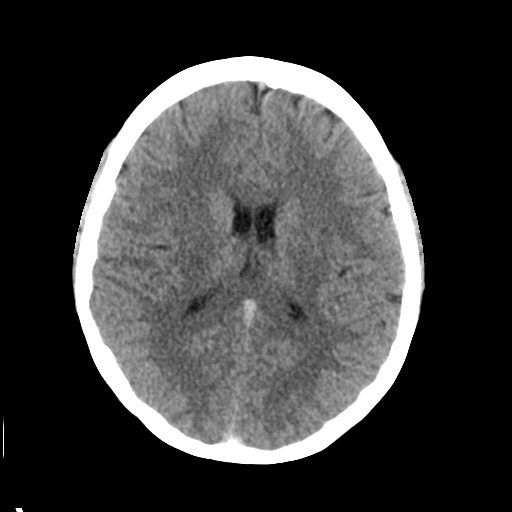
[im 17/33  bone]
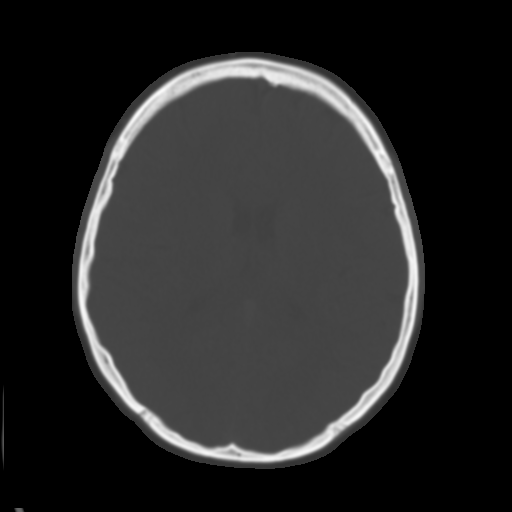
[im 20/33  brain]
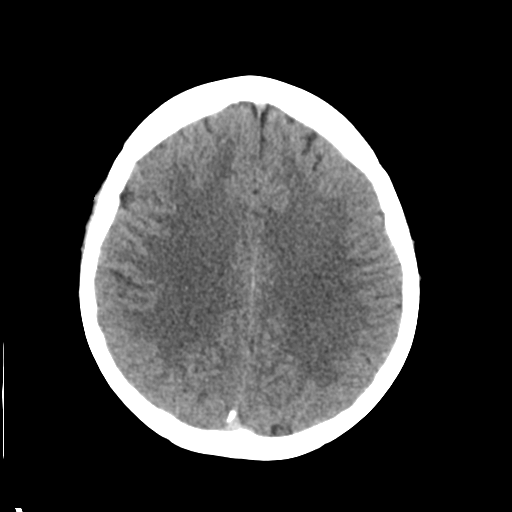
[im 24/33  brain]
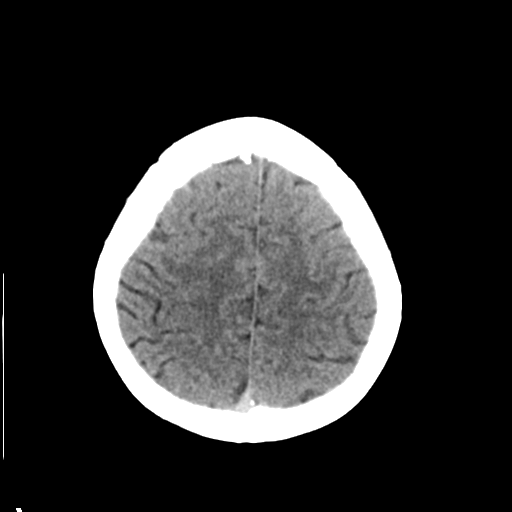
[im 27/33  brain]
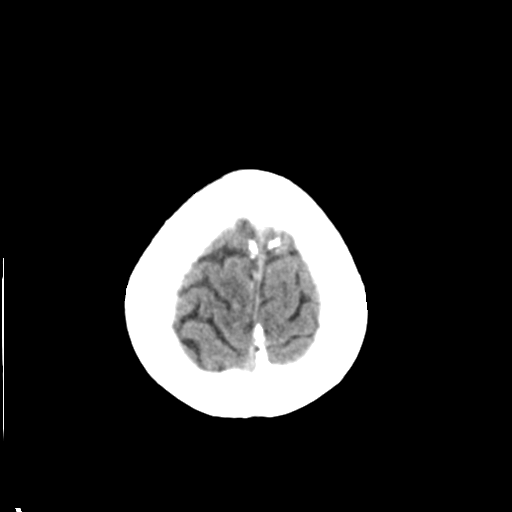
[im 30/33  brain]
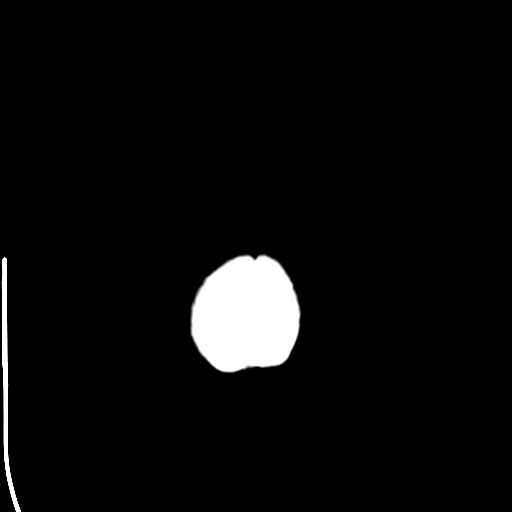
[im 30/33  bone]
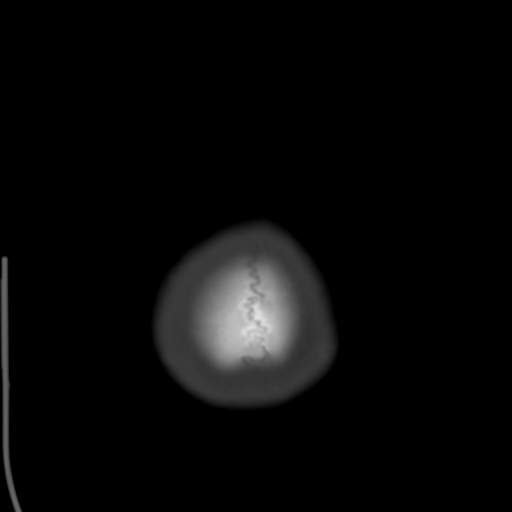

[Series 6: coronal soft tissue · coronal · 0.34mm/px · 3 of 68 slices shown]
[im 23/68  brain]
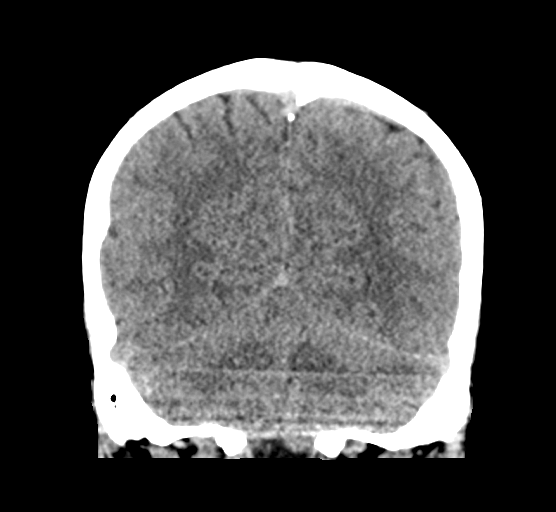
[im 30/68  brain]
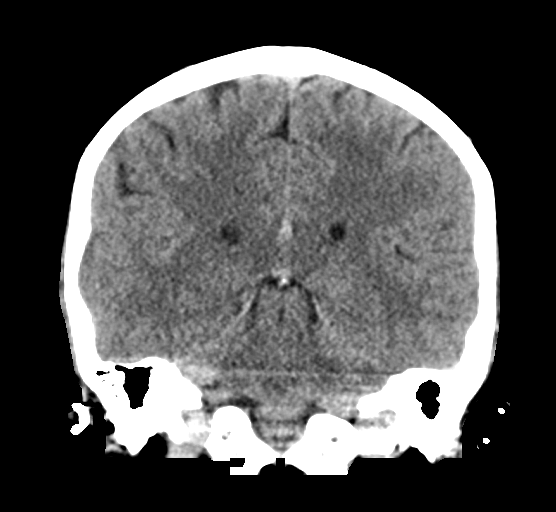
[im 38/68  brain]
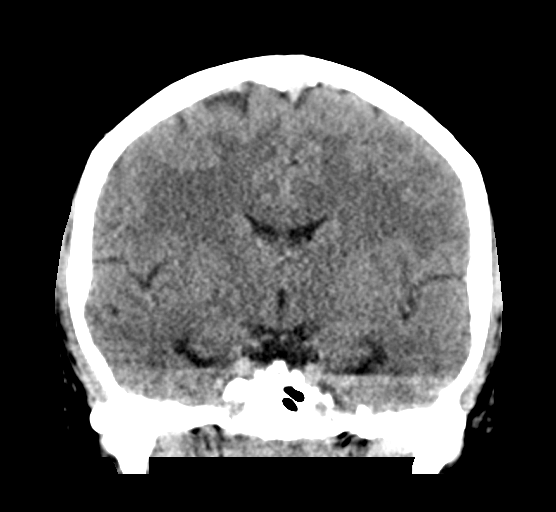

[Series 7: sagittal soft tissue · sagittal · 0.34mm/px · 3 of 64 slices shown]
[im 22/64  brain]
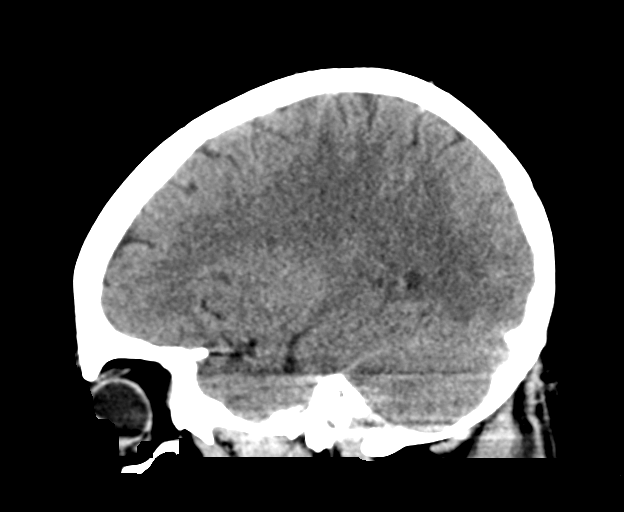
[im 32/64  brain]
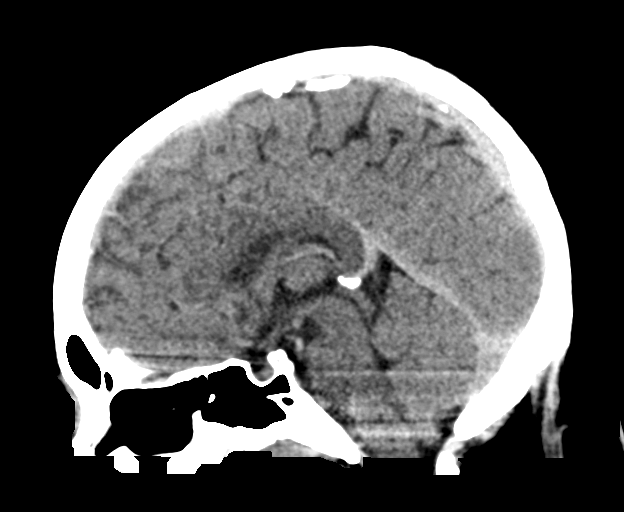
[im 43/64  brain]
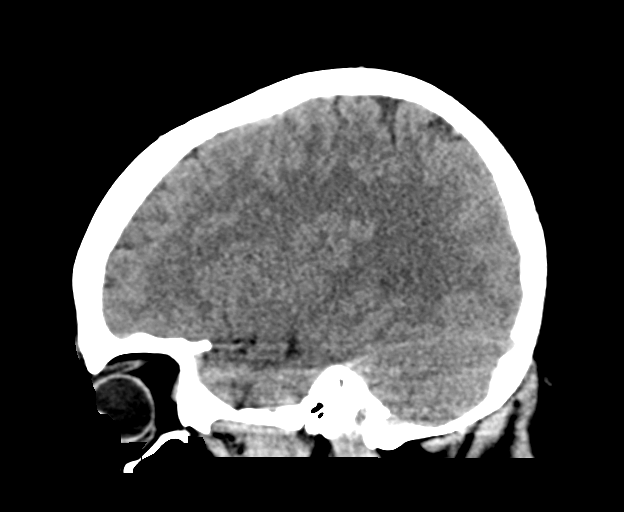

[15 of 47 positions shown; findings below may reference images not displayed]

FINDINGS: CT HEAD FINDINGS

Brain: Normal anatomic configuration. No abnormal intra or
extra-axial mass lesion or fluid collection. No abnormal mass effect
or midline shift. No evidence of acute intracranial hemorrhage or
infarct. Ventricular size is normal. Cerebellum unremarkable.

Vascular: Unremarkable

Skull: Intact

Other: Mastoid air cells and middle ear cavities are clear.

CT MAXILLOFACIAL FINDINGS

Osseous: There is a minimally displaced aligned fracture of the
right nasal bone. No other facial fracture. No mandibular
dislocation. Dental caries involving the posterior maxillary molars
bilaterally.

Orbits: Negative. No traumatic or inflammatory finding.

Sinuses: Clear.

Soft tissues: Mild soft tissue swelling superficial to the nasal
bridge.
IMPRESSION: No acute intracranial injury.  No calvarial fracture.

Minimally displaced, anatomically aligned fracture of the right
nasal bone. Mild overlying soft tissue swelling.

## 2022-12-14 ENCOUNTER — Emergency Department (HOSPITAL_BASED_OUTPATIENT_CLINIC_OR_DEPARTMENT_OTHER): Payer: No Typology Code available for payment source

## 2022-12-14 ENCOUNTER — Other Ambulatory Visit: Payer: Self-pay

## 2022-12-14 ENCOUNTER — Encounter (HOSPITAL_BASED_OUTPATIENT_CLINIC_OR_DEPARTMENT_OTHER): Payer: Self-pay

## 2022-12-14 ENCOUNTER — Emergency Department (HOSPITAL_BASED_OUTPATIENT_CLINIC_OR_DEPARTMENT_OTHER)
Admission: EM | Admit: 2022-12-14 | Discharge: 2022-12-14 | Disposition: A | Discharge status: Home / Self Care | Payer: No Typology Code available for payment source | Attending: Emergency Medicine | Admitting: Emergency Medicine

## 2022-12-14 DIAGNOSIS — M549 Dorsalgia, unspecified: Secondary | ICD-10-CM

## 2022-12-14 DIAGNOSIS — R1013 Epigastric pain: Secondary | ICD-10-CM | POA: Diagnosis present

## 2022-12-14 DIAGNOSIS — K219 Gastro-esophageal reflux disease without esophagitis: Secondary | ICD-10-CM

## 2022-12-14 DIAGNOSIS — M545 Low back pain, unspecified: Secondary | ICD-10-CM | POA: Insufficient documentation

## 2022-12-14 LAB — URINALYSIS, ROUTINE W REFLEX MICROSCOPIC
Bilirubin Urine: NEGATIVE
Glucose, UA: NEGATIVE mg/dL
Hgb urine dipstick: NEGATIVE
Ketones, ur: NEGATIVE mg/dL
Leukocytes,Ua: NEGATIVE
Nitrite: NEGATIVE
Protein, ur: NEGATIVE mg/dL
Specific Gravity, Urine: >=1.03 (ref 1.005–1.030)
pH: 5 (ref 5.0–8.0)

## 2022-12-14 LAB — PREGNANCY, URINE: Preg Test, Ur: NEGATIVE

## 2022-12-14 MED ORDER — KETOROLAC TROMETHAMINE 60 MG/2ML IM SOLN
30.0000 mg | Freq: Once | INTRAMUSCULAR | Status: AC
  Administered 2022-12-14: 30 mg via INTRAMUSCULAR
  Filled 2022-12-14: qty 2

## 2022-12-14 MED ORDER — DICLOFENAC SODIUM ER 100 MG PO TB24: 100.0000 mg | ORAL_TABLET | Freq: Every day | ORAL | 0 refills | Status: DC

## 2022-12-14 MED ORDER — LIDOCAINE 5 % EX PTCH: 1.0000 | MEDICATED_PATCH | CUTANEOUS | 0 refills | Status: DC

## 2022-12-14 MED ORDER — ALUM & MAG HYDROXIDE-SIMETH 200-200-20 MG/5ML PO SUSP
30.0000 mL | Freq: Once | ORAL | Status: AC
  Administered 2022-12-14: 30 mL via ORAL
  Filled 2022-12-14: qty 30

## 2022-12-14 MED ORDER — DICYCLOMINE HCL 10 MG PO CAPS
10.0000 mg | ORAL_CAPSULE | Freq: Once | ORAL | Status: AC
  Administered 2022-12-14: 10 mg via ORAL
  Filled 2022-12-14: qty 1

## 2022-12-14 MED ORDER — OMEPRAZOLE 20 MG PO CPDR: 20.0000 mg | DELAYED_RELEASE_CAPSULE | Freq: Every day | ORAL | 0 refills | Status: DC

## 2022-12-14 NOTE — ED Triage Notes (Signed)
Pt states she awoke from sleep 2 nights ago from constant pain in both sides of lower back that radiates to intermittent upper mid abdominal pain. States it happened again tonight. Reports nausea, denies V/D, urinary symptoms, chest pain, SHOB

## 2022-12-14 NOTE — ED Provider Notes (Signed)
[facility] Provider Note   CSN: [id number] Arrival date & time: 12/14/22  0121     History  Chief Complaint  Patient presents with   Abdominal Pain   Flank Pain    Jasmine Blair is a 27 y.o. female.  The history is provided by the patient.  Abdominal Pain Pain location:  Epigastric Pain radiates to:  Does not radiate Onset quality:  Sudden Timing:  Constant Progression:  Unchanged Chronicity:  New Context: eating   Context comment:  Pizza and pork the previous night, only happening at nighttime Relieved by:  Nothing Worsened by:  Nothing Associated symptoms: no chest pain, no cough, no fever, no nausea and no vomiting   Risk factors: has not had multiple surgeries   Patient is also experiencing Bilateral back pain without trauma.       Home Medications Prior to Admission medications   Medication Sig Start Date End Date Taking? Authorizing Provider  HYDROcodone bit-homatropine (HYCODAN) 5-1.5 MG/5ML syrup Take 5 mLs by mouth every 6 (six) hours as needed for cough. 03/15/22   Mesner, Barbara Cower, MD  Prenatal Vit-Fe Fumarate-FA (PRENATAL MULTIVITAMIN) TABS tablet Take 1 tablet by mouth daily at 12 noon.    [provider]  traZODone (DESYREL) 50 MG tablet Take 50 mg by mouth at bedtime. 11/30/20   [provider]  venlafaxine (EFFEXOR) 37.5 MG tablet Take 37.5 mg by mouth 2 (two) times daily.    [provider]      Allergies    Patient has no known allergies.    Review of Systems   Review of Systems  Constitutional:  Negative for fever.  HENT:  Negative for facial swelling.   Respiratory:  Negative for cough, wheezing and stridor.   Cardiovascular:  Negative for chest pain.  Gastrointestinal:  Positive for abdominal pain. Negative for nausea and vomiting.  Musculoskeletal:  Positive for back pain.  All other systems reviewed and are negative.   Physical Exam Updated Vital Signs BP (!)  153/112 (BP Location: Right Arm)   Pulse 91   Temp 98.5 F (36.9 C) (Oral)   Resp 18   Ht 5\' 2"  (1.575 m)   Wt 81.6 kg   LMP 12/03/2022 (Exact Date)   SpO2 99%   BMI 32.92 kg/m  Physical Exam Vitals and nursing note reviewed.  Constitutional:      General: She is not in acute distress.    Appearance: She is well-developed.  HENT:     Head: Normocephalic and atraumatic.     Nose: Nose normal.  Eyes:     Pupils: Pupils are equal, round, and reactive to light.  Cardiovascular:     Rate and Rhythm: Normal rate and regular rhythm.  Pulmonary:     Effort: No respiratory distress.     Breath sounds: Normal breath sounds.  Abdominal:     General: Bowel sounds are normal. There is no distension.     Palpations: Abdomen is soft.     Tenderness: There is no abdominal tenderness. There is no guarding or rebound.  Musculoskeletal:        General: Normal range of motion.     Cervical back: Neck supple.  Skin:    General: Skin is warm and dry.     Capillary Refill: Capillary refill takes less than 2 seconds.     Findings: No erythema or rash.  Neurological:     General: No focal deficit present.  Mental Status: She is oriented to person, place, and time.     Deep Tendon Reflexes: Reflexes normal.  Psychiatric:        Thought Content: Thought content normal.     ED Results / Procedures / Treatments   Labs (all labs ordered are listed, but only abnormal results are displayed) Results for orders placed or performed during the hospital encounter of 12/14/22  Urinalysis, Routine w reflex microscopic -Urine, Clean Catch  Result Value Ref Range   Color, Urine YELLOW YELLOW   APPearance HAZY (A) CLEAR   Specific Gravity, Urine >=1.030 1.005 - 1.030   pH 5.0 5.0 - 8.0   Glucose, UA NEGATIVE NEGATIVE mg/dL   Hgb urine dipstick NEGATIVE NEGATIVE   Bilirubin Urine NEGATIVE NEGATIVE   Ketones, ur NEGATIVE NEGATIVE mg/dL   Protein, ur NEGATIVE NEGATIVE mg/dL   Nitrite NEGATIVE  NEGATIVE   Leukocytes,Ua NEGATIVE NEGATIVE  Pregnancy, urine  Result Value Ref Range   Preg Test, Ur NEGATIVE NEGATIVE   CT Renal Stone Study  Result Date: 12/14/2022 CLINICAL DATA:  Low back pain for 2 days, initial encounter EXAM: CT ABDOMEN AND PELVIS WITHOUT CONTRAST TECHNIQUE: Multidetector CT imaging of the abdomen and pelvis was performed following the standard protocol without IV contrast. RADIATION DOSE REDUCTION: This exam was performed according to the departmental dose-optimization program which includes automated exposure control, adjustment of the mA and/or kV according to patient size and/or use of iterative reconstruction technique. COMPARISON:  None Available. FINDINGS: Lower chest: No acute abnormality. Hepatobiliary: No focal liver abnormality is seen. No gallstones, gallbladder wall thickening, or biliary dilatation. Pancreas: Unremarkable. No pancreatic ductal dilatation or surrounding inflammatory changes. Spleen: Normal in size without focal abnormality. Adrenals/Urinary Tract: Adrenal glands are within normal limits. Kidneys are within normal limits. No renal calculi or obstructive changes are noted. The bladder is decompressed. Stomach/Bowel: No obstructive or inflammatory changes of the colon are seen. The appendix is within normal limits. Small bowel and stomach are unremarkable. Vascular/Lymphatic: No significant vascular findings are present. No enlarged abdominal or pelvic lymph nodes. Reproductive: Uterus and bilateral adnexa are unremarkable. Other: No abdominal wall hernia or abnormality. No abdominopelvic ascites. Musculoskeletal: No acute or significant osseous findings. IMPRESSION: No renal calculi or obstructive changes are noted. No acute abnormality noted. Electronically Signed   By: Alcide Clever M.D.   On: 12/14/2022 03:58     Radiology CT Renal Stone Study  Result Date: 12/14/2022 CLINICAL DATA:  Low back pain for 2 days, initial encounter EXAM: CT ABDOMEN AND  PELVIS WITHOUT CONTRAST TECHNIQUE: Multidetector CT imaging of the abdomen and pelvis was performed following the standard protocol without IV contrast. RADIATION DOSE REDUCTION: This exam was performed according to the departmental dose-optimization program which includes automated exposure control, adjustment of the mA and/or kV according to patient size and/or use of iterative reconstruction technique. COMPARISON:  None Available. FINDINGS: Lower chest: No acute abnormality. Hepatobiliary: No focal liver abnormality is seen. No gallstones, gallbladder wall thickening, or biliary dilatation. Pancreas: Unremarkable. No pancreatic ductal dilatation or surrounding inflammatory changes. Spleen: Normal in size without focal abnormality. Adrenals/Urinary Tract: Adrenal glands are within normal limits. Kidneys are within normal limits. No renal calculi or obstructive changes are noted. The bladder is decompressed. Stomach/Bowel: No obstructive or inflammatory changes of the colon are seen. The appendix is within normal limits. Small bowel and stomach are unremarkable. Vascular/Lymphatic: No significant vascular findings are present. No enlarged abdominal or pelvic lymph nodes. Reproductive: Uterus and bilateral adnexa are  unremarkable. Other: No abdominal wall hernia or abnormality. No abdominopelvic ascites. Musculoskeletal: No acute or significant osseous findings. IMPRESSION: No renal calculi or obstructive changes are noted. No acute abnormality noted. Electronically Signed   By: Alcide Clever M.D.   On: 12/14/2022 03:58    Procedures Procedures    Medications Ordered in ED Medications  ketorolac (TORADOL) injection 30 mg (30 mg Intramuscular Given 12/14/22 0246)  alum & mag hydroxide-simeth (MAALOX/MYLANTA) 200-200-20 MG/5ML suspension 30 mL (30 mLs Oral Given 12/14/22 0245)  dicyclomine (BENTYL) capsule 10 mg (10 mg Oral Given 12/14/22 0245)    ED Course/ Medical Decision Making/ A&P                                  Medical Decision Making Patient with B low back pain and epigastric pain at night   Amount and/or Complexity of Data Reviewed External Data Reviewed: notes.    Details: Previous notes reviewed  Labs: ordered.    Details: Pregnancy is negative urine is negative for UTI Radiology: ordered and independent interpretation performed.    Details: Normal CT  Risk OTC drugs. Prescription drug management. Risk Details: Symptoms in the abdomen are consistent with GERD, given history and exam.  Considered biliary colic but no RUQ tenderness.  No emesis.  Not constant.  Will start gerd friendly diet and PPI.  Back pain is MSK in nature and will start medication for this.  Stable for discharge.  Strict return.      Final Clinical Impression(s) / ED Diagnoses Final diagnoses:  None   Return for intractable cough, coughing up blood, fevers > 100.4 unrelieved by medication, shortness of breath, intractable vomiting, chest pain, shortness of breath, weakness, numbness, changes in speech, facial asymmetry, abdominal pain, passing out, Inability to tolerate liquids or food, cough, altered mental status or any concerns. No signs of systemic illness or infection. The patient is nontoxic-appearing on exam and vital signs are within normal limits.  I have reviewed the triage vital signs and the nursing notes. Pertinent labs & imaging results that were available during my care of the patient were reviewed by me and considered in my medical decision making (see chart for details). After history, exam, and medical workup I feel the patient has been appropriately medically screened and is safe for discharge home. Pertinent diagnoses were discussed with the patient. Patient was given return precautions.    Rx / DC Orders ED Discharge Orders     None         Pattijo Juste, MD 12/14/22 6440

## 2023-05-01 ENCOUNTER — Encounter (HOSPITAL_BASED_OUTPATIENT_CLINIC_OR_DEPARTMENT_OTHER): Payer: Self-pay

## 2023-05-01 ENCOUNTER — Emergency Department (HOSPITAL_BASED_OUTPATIENT_CLINIC_OR_DEPARTMENT_OTHER): Payer: No Typology Code available for payment source

## 2023-05-01 ENCOUNTER — Other Ambulatory Visit: Payer: Self-pay

## 2023-05-01 ENCOUNTER — Ambulatory Visit: Payer: Self-pay

## 2023-05-01 ENCOUNTER — Emergency Department (HOSPITAL_BASED_OUTPATIENT_CLINIC_OR_DEPARTMENT_OTHER)
Admission: EM | Admit: 2023-05-01 | Discharge: 2023-05-01 | Disposition: A | Discharge status: Home / Self Care | Payer: No Typology Code available for payment source | Attending: Emergency Medicine | Admitting: Emergency Medicine

## 2023-05-01 DIAGNOSIS — Z3A01 Less than 8 weeks gestation of pregnancy: Secondary | ICD-10-CM | POA: Diagnosis not present

## 2023-05-01 DIAGNOSIS — O209 Hemorrhage in early pregnancy, unspecified: Secondary | ICD-10-CM | POA: Insufficient documentation

## 2023-05-01 DIAGNOSIS — O4690 Antepartum hemorrhage, unspecified, unspecified trimester: Secondary | ICD-10-CM

## 2023-05-01 LAB — CBC WITH DIFFERENTIAL/PLATELET
Abs Immature Granulocytes: 0.03 10*3/uL (ref 0.00–0.07)
Basophils Absolute: 0.1 10*3/uL (ref 0.0–0.1)
Basophils Relative: 1 %
Eosinophils Absolute: 0.3 10*3/uL (ref 0.0–0.5)
Eosinophils Relative: 4 %
HCT: 41.8 % (ref 36.0–46.0)
Hemoglobin: 13.9 g/dL (ref 12.0–15.0)
Immature Granulocytes: 0 %
Lymphocytes Relative: 30 %
Lymphs Abs: 2.1 10*3/uL (ref 0.7–4.0)
MCH: 30.6 pg (ref 26.0–34.0)
MCHC: 33.3 g/dL (ref 30.0–36.0)
MCV: 92.1 fL (ref 80.0–100.0)
Monocytes Absolute: 0.5 10*3/uL (ref 0.1–1.0)
Monocytes Relative: 8 %
Neutro Abs: 3.9 10*3/uL (ref 1.7–7.7)
Neutrophils Relative %: 57 %
Platelets: 280 10*3/uL (ref 150–400)
RBC: 4.54 MIL/uL (ref 3.87–5.11)
RDW: 13.1 % (ref 11.5–15.5)
WBC: 6.9 10*3/uL (ref 4.0–10.5)
nRBC: 0 % (ref 0.0–0.2)

## 2023-05-01 LAB — BASIC METABOLIC PANEL
Anion gap: 9 (ref 5–15)
BUN: 6 mg/dL (ref 6–20)
CO2: 23 mmol/L (ref 22–32)
Calcium: 8.8 mg/dL — ABNORMAL LOW (ref 8.9–10.3)
Chloride: 104 mmol/L (ref 98–111)
Creatinine, Ser: 0.71 mg/dL (ref 0.44–1.00)
GFR, Estimated: >60 mL/min (ref >60)
Glucose, Bld: 115 mg/dL — ABNORMAL HIGH (ref 70–99)
Potassium: 4.2 mmol/L (ref 3.5–5.1)
Sodium: 136 mmol/L (ref 135–145)

## 2023-05-01 LAB — HCG, QUANTITATIVE, PREGNANCY: hCG, Beta Chain, Quant, S: 44727 m[IU]/mL — ABNORMAL HIGH (ref <5)

## 2023-05-01 LAB — ABO/RH: ABO/RH(D): O POS

## 2023-05-01 NOTE — ED Triage Notes (Signed)
[redacted] weeks pregnant, had some spotting a few days ago. This morning had some brown discharge and passed potential tissue. Some abdominal cramping.  G2P1.

## 2023-05-01 NOTE — ED Notes (Signed)
 D/c paperwork reviewed with pt, including follow up care.  All questions and/or concerns addressed at time of d/c.  No further needs expressed. . Pt verbalized understanding, Ambulatory without assistance to ED exit, NAD.

## 2023-05-01 NOTE — Discharge Instructions (Signed)
Your [place] results: FINDINGS:  Intrauterine gestational sac: Single    Yolk sac:  Visualized.    Embryo:  Visualized.    Cardiac Activity: Visualized.    Heart Rate: 176 bpm    CRL:  2.5 mm   5 w   6 d                  [place] EDC: 12/26/2023    Subchorionic hemorrhage:  None visualized.    Maternal uterus/adnexae: Normal ovaries bilaterally. No free fluid  in the pelvis.    IMPRESSION:  Single live intrauterine gestation.  No subchorionic hemorrhage.

## 2023-05-01 NOTE — Telephone Encounter (Signed)
  Chief Complaint: Spotting, brown blood, may have passed tissue Symptoms: above Frequency: past few days Pertinent Negatives: Patient denies fever, cramps Disposition: [] ED /[x] Urgent Care (no appt availability in office) / [] Appointment(In office/virtual)/ []  [facility]/ [] Home Care/ [] Refused Recommended Disposition /[] [facility]/ []  Follow-up with PCP Additional Notes: Pt is [redacted] weeks pregnant. She started spotting a few days ago brown blood.  This morning she lost 2 very small blood clots overnight.Today she may have passed some tissue, but is unsure. Pt uses blue dye in toilet. Pt states she took a picture. Pt will go to UC for care today.  Reason for Disposition  SPOTTING lasts > 48 hours or spotting happens more than once in a week  Answer Assessment - Initial Assessment Questions 1. AMOUNT: "Describe the bleeding that you are having."    - SPOTTING: spotting, or pinkish / brownish mucous discharge; does not fill panty liner or pad    - MILD:  less than 1 pad / hour; less than patient's usual menstrual bleeding   - MODERATE: 1-2 pads / hour; 1 menstrual cup every 6 hours; small-medium blood clots (e.g., pea, grape, small coin)   - SEVERE: soaking 2 or more pads/hour for 2 or more hours; 1 menstrual cup every 2 hours; bleeding not contained by pads or continuous red blood from vagina; large blood clots (e.g., golf ball, large coin)      spotting 2. ONSET: "When did the bleeding begin?" "Is it continuing now?"     Started a couple of days ago. 5. ABDOMEN PAIN: "Do you have any pain?" "How bad is the pain?"  (e.g., Scale 1-10; mild, moderate, or severe)   - MILD (1-3): doesn't interfere with normal activities, abdomen soft and not tender to touch    - MODERATE (4-7): interferes with normal activities or awakens from sleep, abdomen tender to touch    - SEVERE (8-10): excruciating pain, doubled over, unable to do any normal activities      no 6. PREGNANCY: "Is there  any chance you are pregnant?" "When was your last menstrual period?"     5 weeks 7. BREASTFEEDING: "Are you breastfeeding?"     no 10. CAUSE: "What do you think is causing the bleeding?" (e.g., recent gyn surgery, recent gyn procedure; known bleeding disorder, cervical cancer, polycystic ovarian disease, fibroids)         *No Answer*  12. OTHER SYMPTOMS: "What other symptoms are you having with the bleeding?" (e.g., passed tissue, vaginal discharge, fever, menstrual-type cramps)       May have passed tissue.  Answer Assessment - Initial Assessment Questions 1  Protocols used: Vaginal Bleeding - Abnormal-A-AH, Pregnancy - Vaginal Bleeding Less Than [redacted] Weeks EGA-A-AH

## 2023-05-01 NOTE — ED Provider Notes (Signed)
[facility] Provider Note   CSN: [id number] Arrival date & time: 05/01/23  0830     History  Chief Complaint  Patient presents with   Vaginal Bleeding    Jasmine Blair is a 28 y.o. female.   Vaginal Bleeding    28 year old G2P1 female at approximately [redacted] weeks gestational age by LMP who presents to the emergency department with vaginal bleeding and cramping.  The patient states that she had spotting over the past few days after an episode of intercourse.  She states that this morning she was having more lower abdominal cramping and then passed what appeared to be tissue that was dark brown in color.  She has had no further episodes of bleeding at this time.  She was advised by her OB to present to the emergency department for evaluation.  Home Medications Prior to Admission medications   Medication Sig Start Date End Date Taking? Authorizing Provider  Diclofenac Sodium CR 100 MG 24 hr tablet Take 1 tablet (100 mg total) by mouth daily. 12/14/22   Palumbo, April, MD  HYDROcodone bit-homatropine (HYCODAN) 5-1.5 MG/5ML syrup Take 5 mLs by mouth every 6 (six) hours as needed for cough. 03/15/22   Mesner, Barbara Cower, MD  lidocaine (LIDODERM) 5 % Place 1 patch onto the skin daily. Remove & Discard patch within 12 hours or as directed by MD 12/14/22   Nicanor Alcon, April, MD  omeprazole (PRILOSEC) 20 MG capsule Take 1 capsule (20 mg total) by mouth daily. 12/14/22   Palumbo, April, MD  Prenatal Vit-Fe Fumarate-FA (PRENATAL MULTIVITAMIN) TABS tablet Take 1 tablet by mouth daily at 12 noon.    [provider]  traZODone (DESYREL) 50 MG tablet Take 50 mg by mouth at bedtime. 11/30/20   [provider]  venlafaxine (EFFEXOR) 37.5 MG tablet Take 37.5 mg by mouth 2 (two) times daily.    [provider]      Allergies    Patient has no known allergies.    Review of Systems   Review of Systems  Genitourinary:  Positive for  vaginal bleeding.  All other systems reviewed and are negative.   Physical Exam Updated Vital Signs BP 131/80   Pulse 87   Temp 98.3 F (36.8 C) (Oral)   Resp 15   Wt 90.7 kg   LMP 03/04/2023 (Approximate)   SpO2 95%   BMI 36.58 kg/m  Physical Exam Vitals and nursing note reviewed. Exam conducted with a chaperone present.  Constitutional:      General: She is not in acute distress. HENT:     Head: Normocephalic and atraumatic.  Eyes:     Conjunctiva/sclera: Conjunctivae normal.     Pupils: Pupils are equal, round, and reactive to light.  Cardiovascular:     Rate and Rhythm: Normal rate and regular rhythm.  Pulmonary:     Effort: Pulmonary effort is normal. No respiratory distress.  Abdominal:     General: There is no distension.     Tenderness: There is no abdominal tenderness. There is no guarding.  Genitourinary:    Comments: Very small amount of blood at the cervical os, os minimal to no dilation noted. Musculoskeletal:        General: No deformity or signs of injury.     Cervical back: Neck supple.  Skin:    Findings: No lesion or rash.  Neurological:     General: No focal deficit present.     Mental Status: She  is alert. Mental status is at baseline.     ED Results / Procedures / Treatments   Labs (all labs ordered are listed, but only abnormal results are displayed) Labs Reviewed  HCG, QUANTITATIVE, PREGNANCY - Abnormal; Notable for the following components:      Result Value   hCG, Beta Chain, Quant, S [phone] (*)    All other components within normal limits  BASIC METABOLIC PANEL - Abnormal; Notable for the following components:   Glucose, Bld 115 (*)    Calcium 8.8 (*)    All other components within normal limits  CBC WITH DIFFERENTIAL/PLATELET  ABO/RH    EKG None  Radiology US OB LESS THAN 14 WEEKS WITH OB TRANSVAGINAL Result Date: 05/01/2023 CLINICAL DATA:  Pregnant patient with vaginal bleeding EXAM: OBSTETRIC <14 WK [place] AND TRANSVAGINAL OB  US TECHNIQUE: Both transabdominal and transvaginal ultrasound examinations were performed for complete evaluation of the gestation as well as the maternal uterus, adnexal regions, and pelvic cul-de-sac. Transvaginal technique was performed to assess early pregnancy. COMPARISON:  Renal stone CT 12/14/2022 FINDINGS: Intrauterine gestational sac: Single Yolk sac:  Visualized. Embryo:  Visualized. Cardiac Activity: Visualized. Heart Rate: 176 bpm CRL:  2.5 mm   5 w   6 d                  [place] EDC: 12/26/2023 Subchorionic hemorrhage:  None visualized. Maternal uterus/adnexae: Normal ovaries bilaterally. No free fluid in the pelvis. IMPRESSION: Single live intrauterine gestation.  No subchorionic hemorrhage. Electronically Signed   By: Annia Belt M.D.   On: 05/01/2023 12:49    Procedures Procedures    Medications Ordered in ED Medications - No data to display  ED Course/ Medical Decision Making/ A&P Clinical Course as of 05/01/23 1254  Tue May 01, 2023  1034 HCG, Beta Chain, [place] Longest(!): 60,454 [JL]  1204 No rh immune globuloin: NOT A RH IMMUNE GLOBULIN CANDIDATE, PT RH POSITIVE Performed at [facility], [address] [place], [place] [address]  [JL]    Clinical Course User Index [JL] Ernie Avena, MD                                 Medical Decision Making Amount and/or Complexity of Data Reviewed Labs: ordered. Decision-making details documented in ED Course. Radiology: ordered.    28 year old G2P1 female at approximately [redacted] weeks gestational age by LMP who presents to the emergency department with vaginal bleeding and cramping.  The patient states that she had spotting over the past few days after an episode of intercourse.  She states that this morning she was having more lower abdominal cramping and then passed what appeared to be tissue that was dark brown in color.  She has had no further episodes of bleeding at this time.  She was advised by her OB to present to the emergency  department for evaluation.  On arrival, the patient was afebrile, not tachycardic, BP 145/86, saturating 98% on room air.  Physical exam revealed a cervical os that was minimally dilated with some blood at the os noted.  Unclear significance of this given the patient's prior gestation.  Not clearly fully dilated and open.  Laboratory evaluation revealed a CBC without a anemia, no leukocytosis, BMP without renal dysfunction, hCG appropriately elevated at 44,727.  ABO/Rh type completed O+, not an Rh immunoglobulin candidate.  Pelvic US:  FINDINGS:  Intrauterine gestational sac: Single  Yolk sac:  Visualized.    Embryo:  Visualized.    Cardiac Activity: Visualized.    Heart Rate: 176 bpm    CRL:  2.5 mm   5 w   6 d                  [place] EDC: 12/26/2023    Subchorionic hemorrhage:  None visualized.    Maternal uterus/adnexae: Normal ovaries bilaterally. No free fluid  in the pelvis.    IMPRESSION:  Single live intrauterine gestation.  No subchorionic hemorrhage.    Patient informed of the results of her ultrasound with a live intrauterine gestation with no subchorionic hemorrhage.  Advise close outpatient follow-up with an OB/GYN within the next week for recheck.  Return precautions provided.  Final Clinical Impression(s) / ED Diagnoses Final diagnoses:  Vaginal bleeding in pregnancy    Rx / DC Orders ED Discharge Orders     None         Ernie Avena, MD 05/01/23 1254

## 2023-10-24 ENCOUNTER — Other Ambulatory Visit: Payer: Self-pay | Admitting: Obstetrics and Gynecology

## 2023-11-26 ENCOUNTER — Other Ambulatory Visit: Payer: Self-pay | Admitting: Obstetrics and Gynecology

## 2023-11-28 ENCOUNTER — Inpatient Hospital Stay (HOSPITAL_COMMUNITY)
Admission: AD | Admit: 2023-11-28 | Discharge: 2023-12-02 | DRG: 788 | Disposition: A | Discharge status: Home / Self Care | Attending: Obstetrics and Gynecology | Admitting: Obstetrics and Gynecology

## 2023-11-28 ENCOUNTER — Encounter (HOSPITAL_COMMUNITY): Payer: Self-pay

## 2023-11-28 DIAGNOSIS — O1414 Severe pre-eclampsia complicating childbirth: Principal | ICD-10-CM | POA: Diagnosis present

## 2023-11-28 DIAGNOSIS — O133 Gestational [pregnancy-induced] hypertension without significant proteinuria, third trimester: Principal | ICD-10-CM

## 2023-11-28 DIAGNOSIS — Z3A36 36 weeks gestation of pregnancy: Secondary | ICD-10-CM

## 2023-11-28 DIAGNOSIS — O34211 Maternal care for low transverse scar from previous cesarean delivery: Secondary | ICD-10-CM | POA: Diagnosis present

## 2023-11-28 DIAGNOSIS — Z87891 Personal history of nicotine dependence: Secondary | ICD-10-CM

## 2023-11-28 DIAGNOSIS — Z79899 Other long term (current) drug therapy: Secondary | ICD-10-CM

## 2023-11-28 DIAGNOSIS — E66813 Obesity, class 3: Secondary | ICD-10-CM | POA: Diagnosis present

## 2023-11-28 DIAGNOSIS — O99214 Obesity complicating childbirth: Secondary | ICD-10-CM | POA: Diagnosis present

## 2023-11-28 DIAGNOSIS — O1490 Unspecified pre-eclampsia, unspecified trimester: Secondary | ICD-10-CM | POA: Diagnosis present

## 2023-11-28 LAB — COMPREHENSIVE METABOLIC PANEL WITH GFR
ALT: 13 U/L (ref 0–44)
AST: 17 U/L (ref 15–41)
Albumin: 2.4 g/dL — ABNORMAL LOW (ref 3.5–5.0)
Alkaline Phosphatase: 169 U/L — ABNORMAL HIGH (ref 38–126)
Anion gap: 10 (ref 5–15)
BUN: <5 mg/dL — ABNORMAL LOW (ref 6–20)
CO2: 20 mmol/L — ABNORMAL LOW (ref 22–32)
Calcium: 8.5 mg/dL — ABNORMAL LOW (ref 8.9–10.3)
Chloride: 111 mmol/L (ref 98–111)
Creatinine, Ser: 0.59 mg/dL (ref 0.44–1.00)
GFR, Estimated: >60 mL/min (ref >60)
Glucose, Bld: 56 mg/dL — ABNORMAL LOW (ref 70–99)
Potassium: 3.7 mmol/L (ref 3.5–5.1)
Sodium: 141 mmol/L (ref 135–145)
Total Bilirubin: 0.7 mg/dL (ref 0.0–1.2)
Total Protein: 6.3 g/dL — ABNORMAL LOW (ref 6.5–8.1)

## 2023-11-28 LAB — CBC WITH DIFFERENTIAL/PLATELET
Basophils Absolute: 0 K/uL (ref 0.0–0.1)
Basophils Relative: 0 %
Eosinophils Absolute: 0.8 K/uL — ABNORMAL HIGH (ref 0.0–0.5)
Eosinophils Relative: 8 %
HCT: 37.7 % (ref 36.0–46.0)
Hemoglobin: 12.4 g/dL (ref 12.0–15.0)
Lymphocytes Relative: 22 %
Lymphs Abs: 2.3 K/uL (ref 0.7–4.0)
MCH: 30.2 pg (ref 26.0–34.0)
MCHC: 32.9 g/dL (ref 30.0–36.0)
MCV: 92 fL (ref 80.0–100.0)
Monocytes Absolute: 0.4 K/uL (ref 0.1–1.0)
Monocytes Relative: 4 %
Neutro Abs: 6.9 K/uL (ref 1.7–7.7)
Neutrophils Relative %: 66 %
Platelets: 265 K/uL (ref 150–400)
RBC: 4.1 MIL/uL (ref 3.87–5.11)
RDW: 13.9 % (ref 11.5–15.5)
WBC: 10.4 K/uL (ref 4.0–10.5)
nRBC: 0 % (ref 0.0–0.2)

## 2023-11-28 LAB — PROTEIN / CREATININE RATIO, URINE
Creatinine, Urine: 75 mg/dL
Protein Creatinine Ratio: 0.4 mg/mg{creat} — ABNORMAL HIGH (ref 0.00–0.15)
Total Protein, Urine: 30 mg/dL

## 2023-11-28 LAB — TYPE AND SCREEN
ABO/RH(D): O POS
Antibody Screen: NEGATIVE

## 2023-11-28 MED ORDER — LABETALOL HCL 5 MG/ML IV SOLN: 80.0000 mg | INTRAVENOUS | Status: DC | PRN

## 2023-11-28 MED ORDER — HYDRALAZINE HCL 20 MG/ML IJ SOLN: 10.0000 mg | INTRAMUSCULAR | Status: DC | PRN

## 2023-11-28 MED ORDER — ACETAMINOPHEN-CAFFEINE 500-65 MG PO TABS
2.0000 | ORAL_TABLET | Freq: Once | ORAL | Status: AC
  Administered 2023-11-28: 2 via ORAL
  Filled 2023-11-28: qty 2

## 2023-11-28 MED ORDER — LACTATED RINGERS IV SOLN: INTRAVENOUS | Status: AC

## 2023-11-28 MED ORDER — BETAMETHASONE SOD PHOS & ACET 6 (3-3) MG/ML IJ SUSP
12.0000 mg | INTRAMUSCULAR | Status: DC
  Administered 2023-11-28: 12 mg via INTRAMUSCULAR
  Filled 2023-11-28: qty 5

## 2023-11-28 MED ORDER — MAGNESIUM SULFATE 40 GM/1000ML IV SOLN
2.0000 g/h | INTRAVENOUS | Status: AC
  Administered 2023-11-28 – 2023-11-30 (×3): 2 g/h via INTRAVENOUS
  Filled 2023-11-28 (×3): qty 1000

## 2023-11-28 MED ORDER — PRENATAL MULTIVITAMIN CH: 1.0000 | ORAL_TABLET | Freq: Every day | ORAL | Status: DC

## 2023-11-28 MED ORDER — LACTATED RINGERS IV SOLN: 125.0000 mL/h | INTRAVENOUS | Status: AC

## 2023-11-28 MED ORDER — MAGNESIUM SULFATE BOLUS VIA INFUSION
4.0000 g | Freq: Once | INTRAVENOUS | Status: AC
  Administered 2023-11-28: 4 g via INTRAVENOUS
  Filled 2023-11-28: qty 1000

## 2023-11-28 MED ORDER — SODIUM CHLORIDE 0.9 % IV SOLN: INTRAVENOUS | Status: AC

## 2023-11-28 MED ORDER — DOCUSATE SODIUM 100 MG PO CAPS: 100.0000 mg | ORAL_CAPSULE | Freq: Every day | ORAL | Status: DC

## 2023-11-28 MED ORDER — CALCIUM CARBONATE ANTACID 500 MG PO CHEW
2.0000 | CHEWABLE_TABLET | ORAL | Status: DC | PRN
  Administered 2023-11-29: 400 mg via ORAL
  Filled 2023-11-28: qty 2

## 2023-11-28 MED ORDER — LABETALOL HCL 5 MG/ML IV SOLN
20.0000 mg | INTRAVENOUS | Status: DC | PRN
  Administered 2023-11-28: 20 mg via INTRAVENOUS
  Filled 2023-11-28: qty 4

## 2023-11-28 MED ORDER — ACETAMINOPHEN 325 MG PO TABS
650.0000 mg | ORAL_TABLET | ORAL | Status: DC | PRN
  Administered 2023-11-28 – 2023-11-29 (×2): 650 mg via ORAL
  Filled 2023-11-28 (×2): qty 2

## 2023-11-28 MED ORDER — LABETALOL HCL 5 MG/ML IV SOLN: 40.0000 mg | INTRAVENOUS | Status: DC | PRN

## 2023-11-28 MED ORDER — BETAMETHASONE SOD PHOS & ACET 6 (3-3) MG/ML IJ SUSP: 12.0000 mg | Freq: Two times a day (BID) | INTRAMUSCULAR | Status: AC

## 2023-11-28 NOTE — H&P (Signed)
 Jasmine Blair is a 28 y.o. female, G2P1001, IUP at 36 weeks, presenting for new onset preeclampsia with severe features, having severe rang eBP, HA, PCR 0.40. H/O vit d def, mass in left breast (2019--core bx at [facility], neg.  Lactational changes, no malignancy, was to f/u in 6 months. Has recurred in 2025 pregnancy--STABLE/BENIGN US  09/12/23, RETURN TO ROUTINE SCREENING) Pt endorse + Fm. Denies vaginal leakage. Denies vaginal bleeding. Denies feeling cxt's.    Patient Active Problem List   Diagnosis Date Noted   Preeclampsia 11/28/2023   Cesarean delivery delivered 03/30/2018   Preeclampsia, severe 03/26/2018     Active Ambulatory Problems    Diagnosis Date Noted   Preeclampsia, severe 03/26/2018   Cesarean delivery delivered 03/30/2018   Resolved Ambulatory Problems    Diagnosis Date Noted   No Resolved Ambulatory Problems   Past Medical History:  Diagnosis Date   Medical history non-contributory       Medications Prior to Admission  Medication Sig Dispense Refill Last Dose/Taking   Prenatal Vit-Fe Fumarate-FA (PRENATAL MULTIVITAMIN) TABS tablet Take 1 tablet by mouth daily at 12 noon.   11/28/2023   Diclofenac  Sodium CR 100 MG 24 hr tablet Take 1 tablet (100 mg total) by mouth daily. 10 tablet 0    HYDROcodone  bit-homatropine (HYCODAN) 5-1.5 MG/5ML syrup Take 5 mLs by mouth every 6 (six) hours as needed for cough. 120 mL 0    lidocaine  (LIDODERM ) 5 % Place 1 patch onto the skin daily. Remove & Discard patch within 12 hours or as directed by MD 30 patch 0    omeprazole  (PRILOSEC) 20 MG capsule Take 1 capsule (20 mg total) by mouth daily. 30 capsule 0    traZODone (DESYREL) 50 MG tablet Take 50 mg by mouth at bedtime.      venlafaxine (EFFEXOR) 37.5 MG tablet Take 37.5 mg by mouth 2 (two) times daily.       Past Medical History:  Diagnosis Date   Medical history non-contributory      No current facility-administered medications on file prior to  encounter.   Current Outpatient Medications on File Prior to Encounter  Medication Sig Dispense Refill   Prenatal Vit-Fe Fumarate-FA (PRENATAL MULTIVITAMIN) TABS tablet Take 1 tablet by mouth daily at 12 noon.     Diclofenac  Sodium CR 100 MG 24 hr tablet Take 1 tablet (100 mg total) by mouth daily. 10 tablet 0   HYDROcodone  bit-homatropine (HYCODAN) 5-1.5 MG/5ML syrup Take 5 mLs by mouth every 6 (six) hours as needed for cough. 120 mL 0   lidocaine  (LIDODERM ) 5 % Place 1 patch onto the skin daily. Remove & Discard patch within 12 hours or as directed by MD 30 patch 0   omeprazole  (PRILOSEC) 20 MG capsule Take 1 capsule (20 mg total) by mouth daily. 30 capsule 0   traZODone (DESYREL) 50 MG tablet Take 50 mg by mouth at bedtime.     venlafaxine (EFFEXOR) 37.5 MG tablet Take 37.5 mg by mouth 2 (two) times daily.       No Known Allergies  History of present pregnancy: Pt Info/Preference:  Screening/Consents:  Labs:   EDD: Estimated Date of Delivery: 12/26/23  Establised: Patient's last menstrual period was 03/04/2023 (approximate).  Anatomy Scan: Date: 19.6 Placenta Location: anterior and low lying  Genetic Screen: Panoroma:LR AFP:  First Tri: Quad:  Office: [facility]            First PNV: 13 weeks Blood Type --/--/PENDING (09/17  1459)O+  Language: english Last PNV: 36 weeks Rhogam    Flu Vaccine:  declined   Antibody PENDING (09/17 1459)Neg  TDaP vaccine declined   GTT: Early: 5.2 Third Trimester: 115  Feeding Plan: Br BTL: no Rubella:  Immune  Contraception: ??? VBAC: no RPR:   nr  Circumcision: ???   HBsAg:  neg  Pediatrician:  ???   HIV:   nr  Prenatal Classes: no Additional US : none GBS:  Pending in office(For PCN allergy, check sensitivities)       Chlamydia: neg    MFM Referral/Consult:  GC: neg  Support Person: partner   PAP: ???  Pain Management: spinal Neonatologist Referral:  Hgb Electrophoresis:  AA  Birth Plan: dcc   Hgb NOB: 14.5    28W: 12.8   OB History     Gravida  2    Para  1   Term      Preterm  1   AB      Living  1      SAB      IAB      Ectopic      Multiple  0   Live Births  1          Past Medical History:  Diagnosis Date   Medical history non-contributory    Past Surgical History:  Procedure Laterality Date   ADENOIDECTOMY     CESAREAN SECTION N/A 03/27/2018   Procedure: CESAREAN SECTION;  Surgeon: Henry Slough, MD;  Location: [facility] BIRTHING SUITES;  Service: Obstetrics;  Laterality: N/A;   TONSILLECTOMY     Family History: family history is not on file. Social History:  reports that she has quit smoking. She has never used smokeless tobacco. She reports that she does not drink alcohol and does not use drugs.   Prenatal Transfer Tool  Maternal Diabetes: No Genetic Screening: Normal Maternal Ultrasounds/Referrals: Normal Fetal Ultrasounds or other Referrals:  None Maternal Substance Abuse:  No Significant Maternal Medications:  None Significant Maternal Lab Results: Nonepending in our office.   ROS:  Review of Systems  Constitutional: Negative.   HENT: Negative.    Eyes: Negative.   Respiratory: Negative.    Cardiovascular: Negative.   Gastrointestinal: Negative.   Genitourinary: Negative.   Musculoskeletal: Negative.   Skin: Negative.   Neurological: Negative.   Endo/Heme/Allergies: Negative.   Psychiatric/Behavioral: Negative.       Physical Exam: BP (!) 156/101   Pulse 80   Temp 99.2 F (37.3 C) (Oral)   Resp 18   Ht 5' 1 (1.549 m)   Wt 101.6 kg   LMP 03/04/2023 (Approximate)   SpO2 99%   BMI 42.32 kg/m   Physical Exam Vitals and nursing note reviewed.  Constitutional:      Appearance: She is well-developed.  HENT:     Head: Normocephalic and atraumatic.  Eyes:     Extraocular Movements: Extraocular movements intact.     Pupils: Pupils are equal, round, and reactive to light.  Cardiovascular:     Rate and Rhythm: Normal rate and regular rhythm.     Heart sounds: Normal heart  sounds.  Pulmonary:     Effort: Pulmonary effort is normal.     Breath sounds: Normal breath sounds.  Abdominal:     Palpations: Abdomen is soft.  Musculoskeletal:        General: Normal range of motion.     Cervical back: Normal range of motion and neck supple.  Skin:  General: Skin is warm.     Capillary Refill: Capillary refill takes less than 2 seconds.  Neurological:     Mental Status: She is alert.  Psychiatric:        Mood and Affect: Mood normal.      NST: FHR baseline 130 bpm, Variability: moderate, Accelerations:present, Decelerations:  Absent= Cat 1/Reactive UC:   none SVE:  Deferred  , vertex verified by fetal sutures.   Labs: Results for orders placed or performed during the hospital encounter of 11/28/23 (from the past 24 hours)  Protein / creatinine ratio, urine     Status: Abnormal   Collection Time: 11/28/23  2:34 PM  Result Value Ref Range   Creatinine, Urine 75 mg/dL   Total Protein, Urine 30 mg/dL   Protein Creatinine Ratio 0.40 (H) 0.00 - 0.15 mg/mg[Cre]  Type and screen [facility]     Status: None (Preliminary result)   Collection Time: 11/28/23  2:59 PM  Result Value Ref Range   ABO/RH(D) PENDING    Antibody Screen PENDING    Sample Expiration      12/01/2023,2359 Performed at [facility], [address], [place], [place] [address]   CBC with Differential/Platelet     Status: Abnormal   Collection Time: 11/28/23  3:00 PM  Result Value Ref Range   WBC 10.4 4.0 - 10.5 K/uL   RBC 4.10 3.87 - 5.11 MIL/uL   Hemoglobin 12.4 12.0 - 15.0 g/dL   HCT 62.2 63.9 - 53.9 %   MCV 92.0 80.0 - 100.0 fL   MCH 30.2 26.0 - 34.0 pg   MCHC 32.9 30.0 - 36.0 g/dL   RDW 86.0 88.4 - 84.4 %   Platelets 265 150 - 400 K/uL   nRBC 0.0 0.0 - 0.2 %   Neutrophils Relative % 66 %   Neutro Abs 6.9 1.7 - 7.7 K/uL   Lymphocytes Relative 22 %   Lymphs Abs 2.3 0.7 - 4.0 K/uL   Monocytes Relative 4 %   Monocytes Absolute 0.4 0.1 - 1.0 K/uL    Eosinophils Relative 8 %   Eosinophils Absolute 0.8 (H) 0.0 - 0.5 K/uL   Basophils Relative 0 %   Basophils Absolute 0.0 0.0 - 0.1 K/uL   WBC Morphology See Note    RBC Morphology MORPHOLOGY UNREMARKABLE    Smear Review See Note   Comprehensive metabolic panel     Status: Abnormal   Collection Time: 11/28/23  3:00 PM  Result Value Ref Range   Sodium 141 135 - 145 mmol/L   Potassium 3.7 3.5 - 5.1 mmol/L   Chloride 111 98 - 111 mmol/L   CO2 20 (L) 22 - 32 mmol/L   Glucose, Bld 56 (L) 70 - 99 mg/dL   BUN <5 (L) 6 - 20 mg/dL   Creatinine, Ser 9.40 0.44 - 1.00 mg/dL   Calcium  8.5 (L) 8.9 - 10.3 mg/dL   Total Protein 6.3 (L) 6.5 - 8.1 g/dL   Albumin 2.4 (L) 3.5 - 5.0 g/dL   AST 17 15 - 41 U/L   ALT 13 0 - 44 U/L   Alkaline Phosphatase 169 (H) 38 - 126 U/L   Total Bilirubin 0.7 0.0 - 1.2 mg/dL   GFR, Estimated >39 >39 mL/min   Anion gap 10 5 - 15    Imaging:  No results found.  MAU Course: Orders Placed This Encounter  Procedures   CBC with Differential/Platelet   Comprehensive metabolic panel  Protein / creatinine ratio, urine   Comprehensive metabolic panel   CBC   Magnesium    Diet regular Room service appropriate? Yes; Fluid consistency: Thin   Notify physician (specify)   Vital signs   Defer vaginal exam for vaginal bleeding or PROM <37 weeks   Apply Antepartum Care Plan   Initiate Oral Care Protocol   Initiate Carrier Fluid Protocol   Fetal monitoring   Bed rest with bathroom privileges   Notify physician (specify) Confirmatory reading of BP> 160/110 15 minutes later   Apply Hypertensive Disorders of Pregnancy Care Plan   Measure blood pressure   Strict intake and output   Full code   Type and screen [facility]   Insert peripheral IV   Admit to Inpatient (patient's expected length of stay will be greater than 2 midnights or inpatient only procedure)   Meds ordered this encounter  Medications   acetaminophen -caffeine  (EXCEDRIN TENSION  HEADACHE) 500-65 MG per tablet 2 tablet   lactated ringers  infusion   acetaminophen  (TYLENOL ) tablet 650 mg   docusate sodium  (COLACE) capsule 100 mg   calcium  carbonate (TUMS - dosed in mg elemental calcium ) chewable tablet 400 mg of elemental calcium    prenatal multivitamin tablet 1 tablet   0.9 %  sodium chloride  infusion   DISCONTD: betamethasone  acetate-betamethasone  sodium phosphate  (CELESTONE ) injection 12 mg   AND Linked Order Group    labetalol  (NORMODYNE ) injection 20 mg    labetalol  (NORMODYNE ) injection 40 mg    labetalol  (NORMODYNE ) injection 80 mg    hydrALAZINE  (APRESOLINE ) injection 10 mg   magnesium  bolus via infusion 4 g   magnesium  sulfate 40 grams in SWI 1000 mL OB infusion   lactated ringers  infusion   betamethasone  acetate-betamethasone  sodium phosphate  (CELESTONE ) injection 12 mg    Assessment/Plan: Jasmine Blair is a 28 y.o. female, G2P1001, IUP at 36 weeks, presenting for new onset preeclampsia with severe features, having severe rang eBP, HA, PCR 0.40. H/O vit d def, mass in left breast (2019--core bx at [facility], neg.  Lactational changes, no malignancy, was to f/u in 6 months. Has recurred in 2025 pregnancy--STABLE/BENIGN US  09/12/23, RETURN TO ROUTINE SCREENING) Pt endorse + Fm. Denies vaginal leakage. Denies vaginal bleeding. Denies feeling cxt's.   FWB: Cat 1 Fetal Tracing.   Plan: Admit to Birthing Suite per consult with DR Henry Routine [facility] orders Pain med tylenol  for HA GBS Pending in our office Start Mag for SR BP: 4gm/hr bolus with 2gm /hr maintenance Labetalol  Per protocol PRN BMZ x2 Q12H per Dr Henry Plan for RCS in the morning, and NPO after MN  [facility] Johnny Latu  CNM, FNP-C, PMHNP-BC  [id number]  [place], [place] [address]  Cell: [phone]  Office Phone: [phone] Fax: [phone] 11/28/2023  5:33 PM

## 2023-11-28 NOTE — MAU Provider Note (Signed)
 Chief Complaint:  Headache and Hypertension   HPI   None     Jasmine Blair is a 28 y.o. G2P0101 at [redacted]w[redacted]d who presents to maternity admissions reporting was sent from [facility] office for rule out preeclampsia. Elevated BP in office today. Patient reports she had a severe range. Mild HA noted today. Denies LOF or VB. Denies visual changes, epigastric pain.   Pregnancy Course: [facility] - New onset elevated blood pressures today.  - Previous CS birth  Past Medical History:  Diagnosis Date   Medical history non-contributory    OB History  Gravida Para Term Preterm AB Living  2 1  1  1   SAB IAB Ectopic Multiple Live Births     0 1    # Outcome Date GA Lbr Len/2nd Weight Sex Type Anes PTL Lv  2 Current           1 Preterm 03/27/18 [redacted]w[redacted]d  2260 g M CS-LTranv EPI  LIV   Past Surgical History:  Procedure Laterality Date   ADENOIDECTOMY     CESAREAN SECTION N/A 03/27/2018   Procedure: CESAREAN SECTION;  Surgeon: Henry Slough, MD;  Location: [facility] BIRTHING SUITES;  Service: Obstetrics;  Laterality: N/A;   TONSILLECTOMY     History reviewed. No pertinent family history. Social History   Tobacco Use   Smoking status: Former   Smokeless tobacco: Never  Advertising account planner   Vaping status: Never Used  Substance Use Topics   Alcohol use: No   Drug use: No   No Known Allergies Medications Prior to Admission  Medication Sig Dispense Refill Last Dose/Taking   Prenatal Vit-Fe Fumarate-FA (PRENATAL MULTIVITAMIN) TABS tablet Take 1 tablet by mouth daily at 12 noon.   11/28/2023   Diclofenac  Sodium CR 100 MG 24 hr tablet Take 1 tablet (100 mg total) by mouth daily. 10 tablet 0    HYDROcodone  bit-homatropine (HYCODAN) 5-1.5 MG/5ML syrup Take 5 mLs by mouth every 6 (six) hours as needed for cough. 120 mL 0    lidocaine  (LIDODERM ) 5 % Place 1 patch onto the skin daily. Remove & Discard patch within 12 hours or as directed by MD 30 patch 0    omeprazole  (PRILOSEC) 20 MG capsule Take 1 capsule (20 mg  total) by mouth daily. 30 capsule 0    traZODone (DESYREL) 50 MG tablet Take 50 mg by mouth at bedtime.      venlafaxine (EFFEXOR) 37.5 MG tablet Take 37.5 mg by mouth 2 (two) times daily.       I have reviewed patient's Past Medical Hx, Surgical Hx, Family Hx, Social Hx, medications and allergies.   ROS  Pertinent items noted in HPI and remainder of comprehensive ROS otherwise negative.   PHYSICAL EXAM  Patient Vitals for the past 24 hrs:  BP Temp Temp src Pulse Resp SpO2 Height Weight  11/28/23 1700 (!) 162/108 -- -- 82 -- 98 % -- --  11/28/23 1645 (!) 158/100 -- -- 77 -- 98 % -- --  11/28/23 1630 (!) 145/99 -- -- 82 -- 98 % -- --  11/28/23 1615 (!) 156/98 -- -- 86 -- 98 % -- --  11/28/23 1600 (!) 149/103 -- -- 77 -- 97 % -- --  11/28/23 1545 (!) 145/101 -- -- 82 -- 97 % -- --  11/28/23 1530 (!) 144/102 -- -- 84 -- 98 % -- --  11/28/23 1515 (!) 138/96 -- -- 80 -- 97 % -- --  11/28/23 1500 (!) 134/92 -- -- 83 --  98 % -- --  11/28/23 1445 (!) 147/100 -- -- 99 -- 98 % -- --  11/28/23 1433 (!) 153/107 -- -- -- -- 98 % -- --  11/28/23 1409 (!) 150/119 99.2 F (37.3 C) Oral 88 18 97 % 5' 1 (1.549 m) 101.6 kg    Physical Exam Vitals and nursing note reviewed.  Constitutional:      Appearance: Normal appearance.  HENT:     Head: Normocephalic.  Cardiovascular:     Rate and Rhythm: Normal rate and regular rhythm.     Pulses: Normal pulses.  Pulmonary:     Effort: Pulmonary effort is normal.     Breath sounds: Normal breath sounds.  Skin:    General: Skin is warm and dry.     Capillary Refill: Capillary refill takes less than 2 seconds.  Neurological:     General: No focal deficit present.     Mental Status: She is alert and oriented to person, place, and time.     Deep Tendon Reflexes:     Reflex Scores:      Patellar reflexes are 2+ on the right side and 2+ on the left side.    Comments: Negative clonus  Psychiatric:        Mood and Affect: Mood normal.        Behavior:  Behavior normal.        Thought Content: Thought content normal.        Judgment: Judgment normal.         Fetal Tracing: Baseline: 125 Variability: moderate Accelerations: 15x15 Decelerations: absent Toco: UI   Labs: Results for orders placed or performed during the hospital encounter of 11/28/23 (from the past 24 hours)  Protein / creatinine ratio, urine     Status: Abnormal   Collection Time: 11/28/23  2:34 PM  Result Value Ref Range   Creatinine, Urine 75 mg/dL   Total Protein, Urine 30 mg/dL   Protein Creatinine Ratio 0.40 (H) 0.00 - 0.15 mg/mg[Cre]  CBC with Differential/Platelet     Status: Abnormal   Collection Time: 11/28/23  3:00 PM  Result Value Ref Range   WBC 10.4 4.0 - 10.5 K/uL   RBC 4.10 3.87 - 5.11 MIL/uL   Hemoglobin 12.4 12.0 - 15.0 g/dL   HCT 62.2 63.9 - 53.9 %   MCV 92.0 80.0 - 100.0 fL   MCH 30.2 26.0 - 34.0 pg   MCHC 32.9 30.0 - 36.0 g/dL   RDW 86.0 88.4 - 84.4 %   Platelets 265 150 - 400 K/uL   nRBC 0.0 0.0 - 0.2 %   Neutrophils Relative % 66 %   Neutro Abs 6.9 1.7 - 7.7 K/uL   Lymphocytes Relative 22 %   Lymphs Abs 2.3 0.7 - 4.0 K/uL   Monocytes Relative 4 %   Monocytes Absolute 0.4 0.1 - 1.0 K/uL   Eosinophils Relative 8 %   Eosinophils Absolute 0.8 (H) 0.0 - 0.5 K/uL   Basophils Relative 0 %   Basophils Absolute 0.0 0.0 - 0.1 K/uL   WBC Morphology See Note    RBC Morphology MORPHOLOGY UNREMARKABLE    Smear Review See Note   Comprehensive metabolic panel     Status: Abnormal   Collection Time: 11/28/23  3:00 PM  Result Value Ref Range   Sodium 141 135 - 145 mmol/L   Potassium 3.7 3.5 - 5.1 mmol/L   Chloride 111 98 - 111 mmol/L   CO2 20 (L) 22 - 32  mmol/L   Glucose, Bld 56 (L) 70 - 99 mg/dL   BUN <5 (L) 6 - 20 mg/dL   Creatinine, Ser 9.40 0.44 - 1.00 mg/dL   Calcium  8.5 (L) 8.9 - 10.3 mg/dL   Total Protein 6.3 (L) 6.5 - 8.1 g/dL   Albumin 2.4 (L) 3.5 - 5.0 g/dL   AST 17 15 - 41 U/L   ALT 13 0 - 44 U/L   Alkaline Phosphatase 169  (H) 38 - 126 U/L   Total Bilirubin 0.7 0.0 - 1.2 mg/dL   GFR, Estimated >39 >39 mL/min   Anion gap 10 5 - 15    Imaging:  No results found.  MDM & MAU COURSE  MDM: Moderate R/O PEC: CMP, CBC, P/C ratio - PCR slightly elevated, others WNL Mild HA resolved with acetaminophen  PE WNL from the standpoint of PEC GHTN vs. PEC with mild features  MAU Course: Orders Placed This Encounter  Procedures   CBC with Differential/Platelet   Comprehensive metabolic panel   Protein / creatinine ratio, urine   Diet regular Room service appropriate? Yes; Fluid consistency: Thin   Notify physician (specify)   Vital signs   Defer vaginal exam for vaginal bleeding or PROM <37 weeks   Apply Antepartum Care Plan   Initiate Oral Care Protocol   Initiate Carrier Fluid Protocol   Fetal monitoring   Bed rest with bathroom privileges   Full code   Type and screen [facility]   Insert peripheral IV   Admit to Inpatient (patient's expected length of stay will be greater than 2 midnights or inpatient only procedure)   Meds ordered this encounter  Medications   acetaminophen -caffeine  (EXCEDRIN TENSION HEADACHE) 500-65 MG per tablet 2 tablet   lactated ringers  infusion   acetaminophen  (TYLENOL ) tablet 650 mg   docusate sodium  (COLACE) capsule 100 mg   calcium  carbonate (TUMS - dosed in mg elemental calcium ) chewable tablet 400 mg of elemental calcium    prenatal multivitamin tablet 1 tablet   0.9 %  sodium chloride  infusion   betamethasone  acetate-betamethasone  sodium phosphate  (CELESTONE ) injection 12 mg    ASSESSMENT   1. Gestational hypertension, third trimester     PLAN  Discussed patient with Dr. Herchel who recommended delivery at 37 weeks with close follow up.  Discussed with Dr. Henry with [facility], and Dr. Henry desires to observe overnight due to report of one severe range in office.  Care turned over to [facility] team.    Camie Rote, MSN, CNM 11/28/2023 5:11 PM   Certified Nurse Midwife, [facility]

## 2023-11-28 NOTE — Anesthesia Preprocedure Evaluation (Signed)
 Anesthesia Evaluation  Patient identified by MRN, date of birth, ID band Patient awake    Reviewed: Allergy & Precautions, H&P , NPO status , Patient's Chart, lab work & pertinent test results, reviewed documented beta blocker date and time   Airway Mallampati: III  TM Distance: >3 FB Neck ROM: Full    Dental no notable dental hx.    Pulmonary former smoker   Pulmonary exam normal breath sounds clear to auscultation       Cardiovascular hypertension (on Mg++), Pt. on medications Normal cardiovascular exam Rhythm:Regular Rate:Normal     Neuro/Psych negative neurological ROS  negative psych ROS   GI/Hepatic negative GI ROS, Neg liver ROS,,,  Endo/Other    Class 3 obesity  Renal/GU negative Renal ROS  negative genitourinary   Musculoskeletal   Abdominal   Peds  Hematology negative hematology ROS (+) Lab Results      Component                Value               Date                      WBC                      9.4                 11/29/2023                HGB                      11.5 (L)            11/29/2023                HCT                      34.6 (L)            11/29/2023                MCV                      92.3                11/29/2023                PLT                      251                 11/29/2023              Anesthesia Other Findings   Reproductive/Obstetrics (+) Pregnancy                              Anesthesia Physical Anesthesia Plan  ASA: 3  Anesthesia Plan: Spinal   Post-op Pain Management: Tylenol  PO (pre-op)*   Induction: Intravenous  PONV Risk Score and Plan: 2 and Ondansetron  and Scopolamine  patch - Pre-op  Airway Management Planned: Natural Airway and Simple Face Mask  Additional Equipment: None  Intra-op Plan:   Post-operative Plan:   Informed Consent: I have reviewed the patients History and Physical, chart, labs and discussed the  procedure including the risks, benefits and alternatives for the proposed anesthesia with the patient or  authorized representative who has indicated his/her understanding and acceptance.     Dental advisory given  Plan Discussed with: Anesthesiologist  Anesthesia Plan Comments: (36 wk G2P1 w severe pre E on Mg++ for repeat C/S x 1)         Anesthesia Quick Evaluation

## 2023-11-28 NOTE — MAU Note (Addendum)
 Jasmine Blair is a 28 y.o. at [redacted]w[redacted]d here in MAU reporting: went to 36wk check up today and her BP was elevated.  Sent in for further eval.  Hx of Pre-E with first preg (c/s fetal intolerance of induction). Has HA, did not take anything for it, denies visual changes or epigastric pain,reports increased swelling in feet. No bleeding or leaking. No abd pain. Reports +FM  Onset of complaint: HA off and on for a few wks, HTN today Pain score: 2 Vitals:   11/28/23 1409  BP: (!) 150/119  Pulse: 88  Resp: 18  Temp: 99.2 F (37.3 C)  SpO2: 97%     FHT:145 Lab orders placed from triage:  urine collected   164/111 was last BP in office today ~1230

## 2023-11-29 ENCOUNTER — Encounter (HOSPITAL_COMMUNITY)
Admission: AD | Disposition: A | Discharge status: Home / Self Care | Payer: Self-pay | Source: Home / Self Care | Attending: Obstetrics and Gynecology

## 2023-11-29 ENCOUNTER — Encounter (HOSPITAL_COMMUNITY): Payer: Self-pay | Admitting: Anesthesiology

## 2023-11-29 ENCOUNTER — Other Ambulatory Visit: Payer: Self-pay

## 2023-11-29 ENCOUNTER — Inpatient Hospital Stay (HOSPITAL_COMMUNITY): Payer: Self-pay | Admitting: Anesthesiology

## 2023-11-29 ENCOUNTER — Encounter (HOSPITAL_COMMUNITY): Payer: Self-pay | Admitting: Obstetrics and Gynecology

## 2023-11-29 DIAGNOSIS — O1414 Severe pre-eclampsia complicating childbirth: Secondary | ICD-10-CM | POA: Diagnosis not present

## 2023-11-29 DIAGNOSIS — Z3A36 36 weeks gestation of pregnancy: Secondary | ICD-10-CM | POA: Diagnosis not present

## 2023-11-29 LAB — MAGNESIUM: Magnesium: 4.1 mg/dL — ABNORMAL HIGH (ref 1.7–2.4)

## 2023-11-29 LAB — COMPREHENSIVE METABOLIC PANEL WITH GFR
ALT: 13 U/L (ref 0–44)
AST: 20 U/L (ref 15–41)
Albumin: 2.4 g/dL — ABNORMAL LOW (ref 3.5–5.0)
Alkaline Phosphatase: 156 U/L — ABNORMAL HIGH (ref 38–126)
Anion gap: 14 (ref 5–15)
BUN: 6 mg/dL (ref 6–20)
CO2: 17 mmol/L — ABNORMAL LOW (ref 22–32)
Calcium: 7.7 mg/dL — ABNORMAL LOW (ref 8.9–10.3)
Chloride: 105 mmol/L (ref 98–111)
Creatinine, Ser: 0.58 mg/dL (ref 0.44–1.00)
GFR, Estimated: >60 mL/min (ref >60)
Glucose, Bld: 169 mg/dL — ABNORMAL HIGH (ref 70–99)
Potassium: 3.7 mmol/L (ref 3.5–5.1)
Sodium: 136 mmol/L (ref 135–145)
Total Bilirubin: 0.6 mg/dL (ref 0.0–1.2)
Total Protein: 6 g/dL — ABNORMAL LOW (ref 6.5–8.1)

## 2023-11-29 LAB — CBC
HCT: 34.6 % — ABNORMAL LOW (ref 36.0–46.0)
Hemoglobin: 11.5 g/dL — ABNORMAL LOW (ref 12.0–15.0)
MCH: 30.7 pg (ref 26.0–34.0)
MCHC: 33.2 g/dL (ref 30.0–36.0)
MCV: 92.3 fL (ref 80.0–100.0)
Platelets: 251 K/uL (ref 150–400)
RBC: 3.75 MIL/uL — ABNORMAL LOW (ref 3.87–5.11)
RDW: 13.8 % (ref 11.5–15.5)
WBC: 9.4 K/uL (ref 4.0–10.5)
nRBC: 0 % (ref 0.0–0.2)

## 2023-11-29 SURGERY — Surgical Case
Anesthesia: Spinal

## 2023-11-29 MED ORDER — SIMETHICONE 80 MG PO CHEW
80.0000 mg | CHEWABLE_TABLET | Freq: Three times a day (TID) | ORAL | Status: DC
  Administered 2023-11-30 – 2023-12-02 (×8): 80 mg via ORAL
  Filled 2023-11-29 (×8): qty 1

## 2023-11-29 MED ORDER — DIBUCAINE (PERIANAL) 1 % EX OINT: 1.0000 | TOPICAL_OINTMENT | CUTANEOUS | Status: DC | PRN

## 2023-11-29 MED ORDER — OXYCODONE HCL 5 MG PO TABS
ORAL_TABLET | ORAL | Status: AC
  Filled 2023-11-29: qty 1

## 2023-11-29 MED ORDER — PHENYLEPHRINE HCL-NACL 20-0.9 MG/250ML-% IV SOLN
INTRAVENOUS | Status: DC | PRN
  Administered 2023-11-29: 60 ug/min via INTRAVENOUS

## 2023-11-29 MED ORDER — MENTHOL 3 MG MT LOZG: 1.0000 | LOZENGE | OROMUCOSAL | Status: DC | PRN

## 2023-11-29 MED ORDER — FENTANYL CITRATE (PF) 100 MCG/2ML IJ SOLN
INTRAMUSCULAR | Status: AC
  Filled 2023-11-29: qty 2

## 2023-11-29 MED ORDER — GABAPENTIN 100 MG PO CAPS: 100.0000 mg | ORAL_CAPSULE | Freq: Two times a day (BID) | ORAL | Status: DC | PRN

## 2023-11-29 MED ORDER — FENTANYL CITRATE (PF) 100 MCG/2ML IJ SOLN
INTRAMUSCULAR | Status: DC | PRN
  Administered 2023-11-29: 15 ug via INTRATHECAL

## 2023-11-29 MED ORDER — LACTATED RINGERS IV SOLN
INTRAVENOUS | Status: DC
Start: 2023-11-29 — End: 2023-12-02

## 2023-11-29 MED ORDER — DEXAMETHASONE SODIUM PHOSPHATE 4 MG/ML IJ SOLN
INTRAMUSCULAR | Status: AC
  Filled 2023-11-29: qty 2

## 2023-11-29 MED ORDER — PHENYLEPHRINE HCL (PRESSORS) 10 MG/ML IV SOLN
INTRAVENOUS | Status: DC | PRN
Start: 2023-11-29 — End: 2023-11-29
  Administered 2023-11-29: 80 ug via INTRAVENOUS
  Administered 2023-11-29: 40 ug via INTRAVENOUS

## 2023-11-29 MED ORDER — OXYTOCIN-SODIUM CHLORIDE 30-0.9 UT/500ML-% IV SOLN
INTRAVENOUS | Status: AC
  Filled 2023-11-29: qty 500

## 2023-11-29 MED ORDER — ACETAMINOPHEN 10 MG/ML IV SOLN
INTRAVENOUS | Status: DC | PRN
  Administered 2023-11-29: 1000 mg via INTRAVENOUS

## 2023-11-29 MED ORDER — DIPHENHYDRAMINE HCL 25 MG PO CAPS: 25.0000 mg | ORAL_CAPSULE | Freq: Four times a day (QID) | ORAL | Status: DC | PRN

## 2023-11-29 MED ORDER — FENTANYL CITRATE (PF) 100 MCG/2ML IJ SOLN: 25.0000 ug | INTRAMUSCULAR | Status: DC | PRN

## 2023-11-29 MED ORDER — ACETAMINOPHEN-CAFFEINE 500-65 MG PO TABS
2.0000 | ORAL_TABLET | Freq: Once | ORAL | Status: AC
  Administered 2023-11-29: 2 via ORAL
  Filled 2023-11-29: qty 2

## 2023-11-29 MED ORDER — OXYCODONE-ACETAMINOPHEN 5-325 MG PO TABS
1.0000 | ORAL_TABLET | Freq: Four times a day (QID) | ORAL | Status: DC | PRN
  Administered 2023-11-30: 1 via ORAL
  Filled 2023-11-29: qty 1

## 2023-11-29 MED ORDER — OXYTOCIN-SODIUM CHLORIDE 30-0.9 UT/500ML-% IV SOLN
INTRAVENOUS | Status: DC | PRN
  Administered 2023-11-29: 300 mL via INTRAVENOUS

## 2023-11-29 MED ORDER — SOD CITRATE-CITRIC ACID 500-334 MG/5ML PO SOLN
30.0000 mL | ORAL | Status: AC
  Administered 2023-11-29: 30 mL via ORAL
  Filled 2023-11-29: qty 30

## 2023-11-29 MED ORDER — PRENATAL MULTIVITAMIN CH
1.0000 | ORAL_TABLET | Freq: Every day | ORAL | Status: DC
  Administered 2023-11-30 – 2023-12-02 (×3): 1 via ORAL
  Filled 2023-11-29 (×3): qty 1

## 2023-11-29 MED ORDER — DEXMEDETOMIDINE HCL IN NACL 80 MCG/20ML IV SOLN
INTRAVENOUS | Status: DC | PRN
  Administered 2023-11-29: 8 ug via INTRAVENOUS
  Administered 2023-11-29: 4 ug via INTRAVENOUS

## 2023-11-29 MED ORDER — OXYCODONE HCL 5 MG PO TABS
5.0000 mg | ORAL_TABLET | Freq: Once | ORAL | Status: AC | PRN
  Administered 2023-11-29: 5 mg via ORAL

## 2023-11-29 MED ORDER — OXYTOCIN-SODIUM CHLORIDE 30-0.9 UT/500ML-% IV SOLN: 2.5000 [IU]/h | INTRAVENOUS | Status: AC

## 2023-11-29 MED ORDER — BUPIVACAINE IN DEXTROSE 0.75-8.25 % IT SOLN
INTRATHECAL | Status: DC | PRN
  Administered 2023-11-29: 12 mg via INTRATHECAL

## 2023-11-29 MED ORDER — MORPHINE SULFATE (PF) 0.5 MG/ML IJ SOLN
INTRAMUSCULAR | Status: DC | PRN
  Administered 2023-11-29: 150 ug via INTRATHECAL

## 2023-11-29 MED ORDER — DEXAMETHASONE SODIUM PHOSPHATE 10 MG/ML IJ SOLN
INTRAMUSCULAR | Status: DC | PRN
  Administered 2023-11-29: 10 mg via INTRAVENOUS

## 2023-11-29 MED ORDER — BETAMETHASONE SOD PHOS & ACET 6 (3-3) MG/ML IJ SUSP
12.0000 mg | Freq: Once | INTRAMUSCULAR | Status: AC
  Administered 2023-11-29: 12 mg via INTRAMUSCULAR

## 2023-11-29 MED ORDER — PHENYLEPHRINE HCL-NACL 20-0.9 MG/250ML-% IV SOLN
INTRAVENOUS | Status: AC
Start: 2023-11-29 — End: 2023-11-29
  Filled 2023-11-29: qty 250

## 2023-11-29 MED ORDER — ZOLPIDEM TARTRATE 5 MG PO TABS: 5.0000 mg | ORAL_TABLET | Freq: Every evening | ORAL | Status: DC | PRN

## 2023-11-29 MED ORDER — OXYCODONE HCL 5 MG/5ML PO SOLN: 5.0000 mg | Freq: Once | ORAL | Status: AC | PRN

## 2023-11-29 MED ORDER — ONDANSETRON HCL 4 MG/2ML IJ SOLN: 4.0000 mg | Freq: Once | INTRAMUSCULAR | Status: DC | PRN

## 2023-11-29 MED ORDER — METOCLOPRAMIDE HCL 5 MG/ML IJ SOLN
INTRAMUSCULAR | Status: AC
  Filled 2023-11-29: qty 2

## 2023-11-29 MED ORDER — COCONUT OIL OIL: 1.0000 | TOPICAL_OIL | Status: DC | PRN

## 2023-11-29 MED ORDER — MORPHINE SULFATE (PF) 0.5 MG/ML IJ SOLN
INTRAMUSCULAR | Status: AC
  Filled 2023-11-29: qty 10

## 2023-11-29 MED ORDER — KETOROLAC TROMETHAMINE 30 MG/ML IJ SOLN
30.0000 mg | Freq: Once | INTRAMUSCULAR | Status: AC
  Administered 2023-11-29: 30 mg via INTRAVENOUS

## 2023-11-29 MED ORDER — SIMETHICONE 80 MG PO CHEW: 80.0000 mg | CHEWABLE_TABLET | ORAL | Status: DC | PRN

## 2023-11-29 MED ORDER — KETOROLAC TROMETHAMINE 30 MG/ML IJ SOLN
INTRAMUSCULAR | Status: AC
Start: 2023-11-29 — End: 2023-11-29
  Filled 2023-11-29: qty 1

## 2023-11-29 MED ORDER — WITCH HAZEL-GLYCERIN EX PADS: 1.0000 | MEDICATED_PAD | CUTANEOUS | Status: DC | PRN

## 2023-11-29 MED ORDER — MEPERIDINE HCL 25 MG/ML IJ SOLN: 6.2500 mg | INTRAMUSCULAR | Status: DC | PRN

## 2023-11-29 MED ORDER — SENNOSIDES-DOCUSATE SODIUM 8.6-50 MG PO TABS
2.0000 | ORAL_TABLET | ORAL | Status: DC
  Administered 2023-11-30 – 2023-12-02 (×3): 2 via ORAL
  Filled 2023-11-29 (×3): qty 2

## 2023-11-29 MED ORDER — TETANUS-DIPHTH-ACELL PERTUSSIS 5-2.5-18.5 LF-MCG/0.5 IM SUSY
0.5000 mL | PREFILLED_SYRINGE | Freq: Once | INTRAMUSCULAR | Status: DC
Start: 2023-11-30 — End: 2023-12-02

## 2023-11-29 MED ORDER — PHENYLEPHRINE 80 MCG/ML (10ML) SYRINGE FOR IV PUSH (FOR BLOOD PRESSURE SUPPORT)
PREFILLED_SYRINGE | INTRAVENOUS | Status: AC
  Filled 2023-11-29: qty 10

## 2023-11-29 MED ORDER — ONDANSETRON HCL 4 MG/2ML IJ SOLN
INTRAMUSCULAR | Status: AC
  Filled 2023-11-29: qty 2

## 2023-11-29 MED ORDER — ONDANSETRON HCL 4 MG/2ML IJ SOLN
INTRAMUSCULAR | Status: DC | PRN
  Administered 2023-11-29: 4 mg via INTRAVENOUS

## 2023-11-29 MED ORDER — CEFAZOLIN SODIUM-DEXTROSE 2-3 GM-%(50ML) IV SOLR
INTRAVENOUS | Status: DC | PRN
  Administered 2023-11-29: 2 g via INTRAVENOUS

## 2023-11-29 MED ORDER — ACETAMINOPHEN 500 MG PO TABS
1000.0000 mg | ORAL_TABLET | Freq: Four times a day (QID) | ORAL | Status: DC | PRN
  Filled 2023-11-29: qty 2

## 2023-11-29 SURGICAL SUPPLY — 35 items
BENZOIN TINCTURE PRP APPL 2/3 (GAUZE/BANDAGES/DRESSINGS) ×1 IMPLANT
CHLORAPREP W/TINT 26 (MISCELLANEOUS) ×2 IMPLANT
CLAMP UMBILICAL CORD (MISCELLANEOUS) ×1 IMPLANT
CLOTH BEACON ORANGE TIMEOUT ST (SAFETY) ×1 IMPLANT
DERMABOND ADVANCED .7 DNX12 (GAUZE/BANDAGES/DRESSINGS) IMPLANT
DRAIN 10X20 FULL PER LF SIL ST (DRAIN) IMPLANT
DRAIN JACKSON PRT FLT 10 (DRAIN) IMPLANT
DRSG OPSITE POSTOP 4X10 (GAUZE/BANDAGES/DRESSINGS) ×1 IMPLANT
ELECTRODE REM PT RTRN 9FT ADLT (ELECTROSURGICAL) ×1 IMPLANT
EVACUATOR SILICONE 100CC (DRAIN) IMPLANT
EXTRACTOR VACUUM M CUP 4 TUBE (SUCTIONS) IMPLANT
GAUZE PAD ABD 7.5X8 STRL (GAUZE/BANDAGES/DRESSINGS) IMPLANT
GAUZE SPONGE 4X4 12PLY STRL LF (GAUZE/BANDAGES/DRESSINGS) IMPLANT
GLOVE BIO SURGEON STRL SZ 6.5 (GLOVE) ×1 IMPLANT
GLOVE BIOGEL PI IND STRL 7.0 (GLOVE) ×2 IMPLANT
GOWN STRL REUS W/TWL LRG LVL3 (GOWN DISPOSABLE) ×2 IMPLANT
KIT ABG SYR 3ML LUER SLIP (SYRINGE) IMPLANT
MAT PREVALON FULL STRYKER (MISCELLANEOUS) IMPLANT
NDL HYPO 25X5/8 SAFETYGLIDE (NEEDLE) IMPLANT
NEEDLE HYPO 25X5/8 SAFETYGLIDE (NEEDLE) IMPLANT
NS IRRIG 1000ML POUR BTL (IV SOLUTION) ×1 IMPLANT
PACK C SECTION WH (CUSTOM PROCEDURE TRAY) ×1 IMPLANT
PAD OB MATERNITY 4.3X12.25 (PERSONAL CARE ITEMS) ×1 IMPLANT
RTRCTR C-SECT PINK 25CM LRG (MISCELLANEOUS) IMPLANT
STRIP CLOSURE SKIN 1/2X4 (GAUZE/BANDAGES/DRESSINGS) ×1 IMPLANT
SUT CHROMIC 0 CT 1 (SUTURE) ×1 IMPLANT
SUT MNCRL AB 3-0 PS2 27 (SUTURE) ×1 IMPLANT
SUT PLAIN 2 0 XLH (SUTURE) ×1 IMPLANT
SUT PLAIN ABS 2-0 CT1 27XMFL (SUTURE) ×2 IMPLANT
SUT SILK 2 0 SH (SUTURE) IMPLANT
SUT VIC AB 0 CTX36XBRD ANBCTRL (SUTURE) ×4 IMPLANT
SUT VIC AB 2-0 SH 27XBRD (SUTURE) IMPLANT
TOWEL OR 17X24 6PK STRL BLUE (TOWEL DISPOSABLE) ×1 IMPLANT
TRAY FOLEY W/BAG SLVR 14FR LF (SET/KITS/TRAYS/PACK) ×1 IMPLANT
WATER STERILE IRR 1000ML POUR (IV SOLUTION) ×1 IMPLANT

## 2023-11-29 NOTE — Transfer of Care (Signed)
 Immediate Anesthesia Transfer of Care Note  Patient: Jasmine Blair  Procedure(s) Performed: CESAREAN DELIVERY  Patient Location: PACU  Anesthesia Type:Spinal  Level of Consciousness: awake, alert , and oriented  Airway & Oxygen Therapy: Patient Spontanous Breathing  Post-op Assessment: Report given to RN and Post -op Vital signs reviewed and stable  Post vital signs: Reviewed and stable  Last Vitals:  Vitals Value Taken Time  BP    Temp    Pulse    Resp    SpO2      Last Pain:  Vitals:   11/29/23 1624  TempSrc: Oral  PainSc: 5          Complications: No notable events documented.

## 2023-11-29 NOTE — Plan of Care (Signed)
  Problem: Education: Goal: Knowledge of disease or condition will improve Outcome: Progressing Goal: Knowledge of the prescribed therapeutic regimen will improve Outcome: Progressing Goal: Individualized Educational Video(s) Outcome: Progressing   Problem: Clinical Measurements: Goal: Complications related to the disease process, condition or treatment will be avoided or minimized Outcome: Progressing   Problem: Education: Goal: Knowledge of disease or condition will improve Outcome: Progressing Goal: Knowledge of the prescribed therapeutic regimen will improve Outcome: Progressing   Problem: Fluid Volume: Goal: Peripheral tissue perfusion will improve Outcome: Progressing   Problem: Clinical Measurements: Goal: Complications related to disease process, condition or treatment will be avoided or minimized Outcome: Progressing   Problem: Education: Goal: Knowledge of General Education information will improve Description: Including pain rating scale, medication(s)/side effects and non-pharmacologic comfort measures Outcome: Progressing   Problem: Health Behavior/Discharge Planning: Goal: Ability to manage health-related needs will improve Outcome: Progressing   Problem: Clinical Measurements: Goal: Ability to maintain clinical measurements within normal limits will improve Outcome: Progressing Goal: Will remain free from infection Outcome: Progressing Goal: Diagnostic test results will improve Outcome: Progressing Goal: Respiratory complications will improve Outcome: Progressing Goal: Cardiovascular complication will be avoided Outcome: Progressing   Problem: Activity: Goal: Risk for activity intolerance will decrease Outcome: Progressing   Problem: Nutrition: Goal: Adequate nutrition will be maintained Outcome: Progressing   Problem: Coping: Goal: Level of anxiety will decrease Outcome: Progressing   Problem: Elimination: Goal: Will not experience  complications related to bowel motility Outcome: Progressing Goal: Will not experience complications related to urinary retention Outcome: Progressing   Problem: Pain Managment: Goal: General experience of comfort will improve and/or be controlled Outcome: Progressing   Problem: Safety: Goal: Ability to remain free from injury will improve Outcome: Progressing   Problem: Skin Integrity: Goal: Risk for impaired skin integrity will decrease Outcome: Progressing

## 2023-11-29 NOTE — Anesthesia Procedure Notes (Signed)
 Spinal  Patient location during procedure: OB Start time: 11/29/2023 6:05 PM End time: 11/29/2023 6:08 PM Reason for block: surgical anesthesia Staffing Performed: anesthesiologist  Anesthesiologist: Jefm Garnette LABOR, MD Performed by: Jefm Garnette LABOR, MD Authorized by: Jefm Garnette LABOR, MD   Preanesthetic Checklist Completed: patient identified, IV checked, risks and benefits discussed, surgical consent, monitors and equipment checked, pre-op evaluation and timeout performed Spinal Block Patient position: sitting Prep: DuraPrep and site prepped and draped Patient monitoring: heart rate, cardiac monitor, continuous pulse ox and blood pressure Approach: midline Location: L3-4 Injection technique: single-shot Needle Needle type: Pencan  Needle gauge: 24 G Needle length: 10 cm Needle insertion depth: 6 cm Assessment Sensory level: T4 Events: CSF return Additional Notes  1 Attempt (s). Pt tolerated procedure well.

## 2023-11-29 NOTE — Progress Notes (Signed)
 Date of Initial H&P: 9/18  History reviewed, patient examined, no change in status, stable for surgery.  R&B reviewed with the pt in detail.

## 2023-11-29 NOTE — Progress Notes (Signed)
   Labor Progress Note  Jasmine Blair is a 28 y.o. female, G2P1001, IUP at 36 weeks, presenting for new onset preeclampsia with severe features, having severe rang eBP, HA, PCR 0.40. H/O vit d def, mass in left breast (2019--core bx at [facility], neg.  Lactational changes, no malignancy, was to f/u in 6 months. Has recurred in 2025 pregnancy--STABLE/BENIGN US  09/12/23, RETURN TO ROUTINE SCREENING) Pt endorse + Fm. Denies vaginal leakage. Denies vaginal bleeding. Denies feeling cxt's.   Subjective: Pt in bned resting well, does complain of having a HAS which tylenol  and Excedrin did not help still 4/10 on pain scale other wise denies vision changes or RUQ pain. Pt riddled with anxiety about plans from RCS, to baby circ PP, to gowns, to clothes, discussed anxiety and potential to start SSRI postpartum, pt will think about this. Reviewed questions and answered them.  Patient Active Problem List   Diagnosis Date Noted   Preeclampsia 11/28/2023   Cesarean delivery delivered 03/30/2018   Preeclampsia, severe 03/26/2018  .      Objective: BP 135/86 (BP Location: Right Arm)   Pulse 93   Temp 97.7 F (36.5 C) (Oral)   Resp 18   Ht 5' 1 (1.549 m)   Wt 101.6 kg   LMP 03/04/2023 (Approximate)   SpO2 99%   BMI 42.32 kg/m  I/O last 3 completed shifts: In: 2201.2 [P.O.:1140; I.V.:1061.2] Out: 2400 [Urine:2400] Total I/O In: -  Out: 300 [Urine:300] NST: FHR baseline 130 bpm, Variability: moderate, Accelerations:present, Decelerations:  Absent= Cat 1/Reactive CTX:  none Uterus gravid, soft non tender, moderate to palpate with contractions.  SVE:   Deferred Pitocin  at 60mUn/min  Assessment:  Jasmine Blair is a 28 y.o. female, G2P1001, IUP at 36 weeks, presenting for new onset preeclampsia with severe features, having severe rang eBP, HA, PCR 0.40. H/O vit d def, mass in left breast (2019--core bx at [facility], neg.  Lactational changes, no malignancy, was to f/u in  6 months. Has recurred in 2025 pregnancy--STABLE/BENIGN US  09/12/23, RETURN TO ROUTINE SCREENING) Pt endorse + Fm. Denies vaginal leakage. Denies vaginal bleeding. Denies feeling cxt's.  Patient Active Problem List   Diagnosis Date Noted   Preeclampsia 11/28/2023   Cesarean delivery delivered 03/30/2018   Preeclampsia, severe 03/26/2018    Plan: Continue RCS planned today @ 1630 Continue mag 2gm/hr, labs in morning and continue mag 24 hours PP, with labetalol  as indicated SCD Shave site Bicitra NPO since 6am Spinal Foley 2gm Ancef  Azithromycin  MooD: potential start SSRI PP if pt desires for anxiety.   Md Dillard aware of plan and verbalized agreement.   Gared Gillie  CNM, FNP-C, PMHNP-BC  [id number]  [place], [place] [address]  Cell: [phone]  Office Phone: [phone] Fax: [phone] 11/29/2023  11:57 AM

## 2023-11-29 NOTE — Op Note (Signed)
 Cesarean Section Procedure Note   Jasmine Blair  11/28/2023 - 11/29/2023  Indications: preeclampsia with  SF.  H/O PREVIOUS CS   Pre-operative Diagnosis: repeat cesarean section for severe preeclampsia with severe features.   Post-operative Diagnosis: Same   Surgeon: Surgeons and Role:    * Armond Cape, MD - Primary   Assistants: Vermell Montana  CNM   Anesthesia: spinal   Procedure Details:  The patient was seen in the Holding Room. The risks, benefits, complications, treatment options, and expected outcomes were discussed with the patient. The patient concurred with the proposed plan, giving informed consent. identified as [employer] and the procedure verified as C-Section Delivery. A Time Out was held and the above information confirmed.  After induction of anesthesia, the patient was draped and prepped in the usual sterile manner. A transverse incision was made and carried down through the subcutaneous tissue to the fascia. Fascial incision was made in the midline and extended transversely. The fascia was separated from the underlying rectus muscle superiorly and inferiorly. The peritoneum was identified and entered. Peritoneal incision was extended longitudinally with good visualization of bowel and bladder. The utero-vesical peritoneal reflection was incised transversely and the bladder flap was bluntly freed from the lower uterine segment.  An alexsis retractor was placed in the abdomen.   A low transverse uterine incision was made. Delivered from cephalic presentation was a  infant, with Apgar scores of 9 at one minute and 9 at five minutes. Cord ph was not sent the umbilical cord was clamped and cut cord blood was obtained for evaluation. The placenta was removed Intact and appeared normal. The uterine outline, tubes and ovaries appeared normal}. The uterine incision was closed with running locked sutures of 0Vicryl. A second layer 0 vicrlyl was used to imbricate the  uterine incision    Hemostasis was observed. Lavage was carried out until clear. The alexsis was removed.  The peritoneum was closed with 0 chromic.  The muscles were examined and any bleeders were made hemostatic using bovie cautery device.   The fascia was then reapproximated with running sutures of 0 vicryl.  A JP drain was placed  the SQ was about 6 cm.    The subcutaneous tissue was reapproximated  With interrupted stitches using 2-0 plain gut. The subcuticular closure was performed using 3-29monocryl     Instrument, sponge, and needle counts were correct prior the abdominal closure and were correct at the conclusion of the case.    Findings: infant was delivered from vtx presentation. The fluid was clear.  The uterus tubes and ovaries appeared normal.     Estimated Blood Loss: 277 ml   Total IV Fluids:   Urine Output: 100CC OF clear urine  Specimens: placenta  Complications: no complications  Disposition: PACU - hemodynamically stable.   Maternal Condition: stable   Baby condition / location:  Couplet care / Skin to Skin  Attending Attestation: I was present and scrubbed for the entire procedure.   Signed: Surgeon(s): Remy Dia, MD

## 2023-11-30 ENCOUNTER — Encounter (HOSPITAL_COMMUNITY): Payer: Self-pay | Admitting: Obstetrics and Gynecology

## 2023-11-30 LAB — CBC
HCT: 35.1 % — ABNORMAL LOW (ref 36.0–46.0)
Hemoglobin: 11.6 g/dL — ABNORMAL LOW (ref 12.0–15.0)
MCH: 31 pg (ref 26.0–34.0)
MCHC: 33 g/dL (ref 30.0–36.0)
MCV: 93.9 fL (ref 80.0–100.0)
Platelets: 311 K/uL (ref 150–400)
RBC: 3.74 MIL/uL — ABNORMAL LOW (ref 3.87–5.11)
RDW: 14.3 % (ref 11.5–15.5)
WBC: 19.5 K/uL — ABNORMAL HIGH (ref 4.0–10.5)
nRBC: 0 % (ref 0.0–0.2)

## 2023-11-30 LAB — COMPREHENSIVE METABOLIC PANEL WITH GFR
ALT: 15 U/L (ref 0–44)
AST: 30 U/L (ref 15–41)
Albumin: 2.5 g/dL — ABNORMAL LOW (ref 3.5–5.0)
Alkaline Phosphatase: 138 U/L — ABNORMAL HIGH (ref 38–126)
Anion gap: 15 (ref 5–15)
BUN: 6 mg/dL (ref 6–20)
CO2: 18 mmol/L — ABNORMAL LOW (ref 22–32)
Calcium: 7.1 mg/dL — ABNORMAL LOW (ref 8.9–10.3)
Chloride: 103 mmol/L (ref 98–111)
Creatinine, Ser: 0.77 mg/dL (ref 0.44–1.00)
GFR, Estimated: >60 mL/min (ref >60)
Glucose, Bld: 177 mg/dL — ABNORMAL HIGH (ref 70–99)
Potassium: 4.1 mmol/L (ref 3.5–5.1)
Sodium: 136 mmol/L (ref 135–145)
Total Bilirubin: 0.6 mg/dL (ref 0.0–1.2)
Total Protein: 6.1 g/dL — ABNORMAL LOW (ref 6.5–8.1)

## 2023-11-30 LAB — MAGNESIUM: Magnesium: 5.1 mg/dL — ABNORMAL HIGH (ref 1.7–2.4)

## 2023-11-30 MED ORDER — IBUPROFEN 600 MG PO TABS
600.0000 mg | ORAL_TABLET | Freq: Four times a day (QID) | ORAL | Status: DC
  Administered 2023-11-30 – 2023-12-02 (×8): 600 mg via ORAL
  Filled 2023-11-30 (×8): qty 1

## 2023-11-30 MED ORDER — ACETAMINOPHEN 500 MG PO TABS
1000.0000 mg | ORAL_TABLET | Freq: Four times a day (QID) | ORAL | Status: AC
  Administered 2023-11-30 – 2023-12-02 (×8): 1000 mg via ORAL
  Filled 2023-11-30 (×9): qty 2

## 2023-11-30 MED ORDER — OXYCODONE HCL 5 MG PO TABS: 5.0000 mg | ORAL_TABLET | ORAL | Status: DC | PRN

## 2023-11-30 NOTE — Anesthesia Postprocedure Evaluation (Signed)
 Anesthesia Post Note  Patient: Jasmine Blair  Procedure(s) Performed: CESAREAN DELIVERY     Patient location during evaluation: Mother Baby Anesthesia Type: Spinal Level of consciousness: oriented and awake and alert Pain management: pain level controlled Vital Signs Assessment: post-procedure vital signs reviewed and stable Respiratory status: spontaneous breathing and respiratory function stable Cardiovascular status: blood pressure returned to baseline and stable Postop Assessment: no headache, no backache, no apparent nausea or vomiting and able to ambulate Anesthetic complications: no   No notable events documented.  Last Vitals:  Vitals:   11/30/23 0412 11/30/23 0500  BP: (!) 150/88   Pulse: 98   Resp: 17 18  Temp: 36.4 C   SpO2: 100%     Last Pain:  Vitals:   11/30/23 0412  TempSrc: Oral  PainSc:    Pain Goal:                   [name]

## 2023-11-30 NOTE — Lactation Note (Signed)
 This note was copied from a baby's chart. Lactation Consultation Note  Patient Name: Jasmine Blair Date: 11/30/2023 Age:26 hours Reason for consult: Initial assessment;Late-preterm 34-36.6wks;1st time breastfeeding;Infant < 6lbs;Other (Comment) (Pre-E, Hx of mass in her L side in 2019)  Visited with family of 36 2/7 weeks AGA female Jasmine Blair; Jasmine Blair is a P2 but this is her first time breastfeeding. Assisted with feeding at the breast and pumping session right after while SLP Jasmine Blair was working on bottle feedings. She reported (+) breast changes during the pregnancy, and wants to give breastfeeding a try this time. This LC took Jasmine Blair to the R side in cross cradle hold and he was able to latch after a few attempts but fell asleep at the breast shortly after. He breastfed intermittently on and off for a total of 5 minutes and required stimulation to continue sucking (see LATCH score). Reviewed normal LPI behavior & policy, feeding cues, cluster feeding, size of baby's stomach, pumping schedule, pumping log, pump settings, secretory activation, CDC, supplementation and anticipatory guidelines.  Maternal Data Has patient been taught Hand Expression?: Yes Does the patient have breastfeeding experience prior to this delivery?: No  Feeding Mother's Current Feeding Choice: Breast Milk and Formula Nipple Type: Dr. Jonna Blair  LATCH Score Latch: Repeated attempts needed to sustain latch, nipple held in mouth throughout feeding, stimulation needed to elicit sucking reflex.  Audible Swallowing: None  Type of Nipple: Everted at rest and after stimulation (short shafted)  Comfort (Breast/Nipple): Soft / non-tender  Hold (Positioning): Assistance needed to correctly position infant at breast and maintain latch.  LATCH Score: 6  Lactation Tools Discussed/Used Tools: Pump;Flanges;Coconut oil;Hands-free pumping top Flange Size: 18 Breast pump type: Double-Electric Breast Pump Pump  Education: Setup, frequency, and cleaning;Milk Storage Reason for Pumping: LPI < 6 lbs Pumping frequency: initiated pumping at 3 hours post-partum Pumped volume:  (drops)  Interventions Interventions: Breast feeding basics reviewed;Breast massage;Hand express;Coconut oil;DEBP;Education;LC Services brochure;CDC milk storage guidelines;CDC Guidelines for Breast Pump Cleaning;NICU Pumping Log  Plan STS around care times Breast massage, hand expression and coconut oil prior to pumping Pump both breasts on initiate mode every 3 hours for 15 minutes, ideally 8 pumping sessions/24 hours Offer the breast +8 times/24 hours or sooner if feeding cues are present, limiting breastfeeding sessions to < 15 minutes Supplement baby with EBM/Similac 22 calorie formula every 3 hours; day 1= 10-12 ml; day 2= 15-18 ml; day 3 20-24 ml  No other support person at this time. All questions and concerns answered, family to contact [facility] services PRN.  Discharge Pump: Hands Free;Personal (MomCozy wearable)  Consult Status Consult Status: Follow-up Date: 12/01/23 Follow-up type: In-patient   Jasmine Blair Crate 11/30/2023, 12:26 PM

## 2023-11-30 NOTE — Progress Notes (Signed)
 Subjective: POD# 1 Information for the patient's newborn:  Jasmine, Blair [[id number]]  female  Baby's Name Jasmine Blair Circumcision Desire's in-patient  Reports feeling tired, but good. Requests a shower after magnesium  is discontinued. Instructed on how to remove the pressure dressing in the shower while maintaining the integrity of the honeycomb dressing and the JP drain.  Feeding: breast Reports tolerating PO and denies N/V, foley removed, ambulating and urinating w/o difficulty  Pain controlled with PO meds Denies HA/SOB/dizziness  Flatus not passing Vaginal bleeding is normal, no clots     Objective:  VS:  Vitals:   11/30/23 1223 11/30/23 1315 11/30/23 1415 11/30/23 1515  BP: 126/79     Pulse: 75     Resp: 18 16 18 16   Temp: 98.4 F (36.9 C)     TempSrc: Oral     SpO2: 96%     Weight:      Height:        Intake/Output Summary (Last 24 hours) at 11/30/2023 1553 Last data filed at 11/30/2023 1515 Gross per 24 hour  Intake 6152.81 ml  Output 5303 ml  Net 849.81 ml     Recent Labs    11/29/23 0427 11/30/23 0409  WBC 9.4 19.5*  HGB 11.5* 11.6*  HCT 34.6* 35.1*  PLT 251 311    Blood type: --/--/O POS (09/17 1459) Rubella:      Physical Exam:  General: alert, cooperative, and no distress CV: Regular rate and rhythm Resp: clear Abdomen: soft, non-tender, hypoactive BS x4 quads Incision: clean, dry, intact, and less than 3 mL in JP drain at time of exam Perineum:  Uterine Fundus: firm, below umbilicus, nontender Lochia: minimal Ext: trace edema, negative for tenderness, pain, and cords   Assessment/Plan: 28 y.o.   POD# 1. [id number]                  Principal Problem:   Preeclampsia  Routine post-op PP care          Advance diet as tolerated Advised warm fluids and ambulation to improve GI motility Lactation support PRN D/C magnesium  at 1941 Anticipate D/C POD 2 or 3  Jasmine KATHEE Peal, DNP, CNM 11/30/2023, 3:53 PM

## 2023-12-01 MED ORDER — NIFEDIPINE ER OSMOTIC RELEASE 30 MG PO TB24
30.0000 mg | ORAL_TABLET | Freq: Once | ORAL | Status: AC
  Administered 2023-12-01: 30 mg via ORAL
  Filled 2023-12-01: qty 1

## 2023-12-01 MED ORDER — NIFEDIPINE ER OSMOTIC RELEASE 30 MG PO TB24: 30.0000 mg | ORAL_TABLET | Freq: Every day | ORAL | Status: DC

## 2023-12-01 NOTE — Progress Notes (Signed)
 Subjective: POD# 2 Information for the patient's newborn:  Jasmine Blair, Jasmine Blair [[id number]]  female  Baby's Name Jasmine Blair Circumcision Done  Reports feeling sleepy. Does not make eye contact and displays frustration when informed of upward trend in BP, the need for oral antihypertensives, and an additional night's stay for observation and management.  Reports mild HA, but declines intervention, denies visual changes and RUQ pain  Flatus passing Vaginal bleeding is normal, no clots     Objective:  VS:  Vitals:   11/30/23 2326 12/01/23 0322 12/01/23 0820 12/01/23 1202  BP: 127/75 132/81 (!) 140/82 132/86  Pulse: 79 95 94 93  Resp: 18 18 16 17   Temp: (!) 97.5 F (36.4 C) 98 F (36.7 C) 98.2 F (36.8 C) 98.4 F (36.9 C)  TempSrc: Oral Oral Oral Oral  SpO2: 100% 99% 100% 99%  Weight:      Height:        Intake/Output Summary (Last 24 hours) at 12/01/2023 1327 Last data filed at 12/01/2023 0800 Gross per 24 hour  Intake 1560 ml  Output 2450 ml  Net -890 ml     Recent Labs    11/29/23 0427 11/30/23 0409  WBC 9.4 19.5*  HGB 11.5* 11.6*  HCT 34.6* 35.1*  PLT 251 311    Blood type: --/--/O POS (09/17 1459) Rubella:      Physical Exam:  General: alert, cooperative, fatigued, and no distress CV: Regular rate and rhythm Resp: clear Abdomen: soft, nontender, normal bowel sounds Incision: clean, dry, and intact Perineum:  Uterine Fundus: firm, below umbilicus, nontender Lochia: minimal Ext: trace edema, negative for tenderness, pain, and cords   Assessment/Plan: 28 y.o.   POD# 2. [id number]                  Principal Problem:   Preeclampsia    -mild to moderate range BP    -start Procardia  XL 30 PO daily Anticipate D/C POD 3  Jasmine KATHEE Peal, DNP, CNM 12/01/2023, 1:27 PM

## 2023-12-02 MED ORDER — OXYCODONE HCL 5 MG PO TABS: 5.0000 mg | ORAL_TABLET | ORAL | 0 refills | Status: AC | PRN

## 2023-12-02 MED ORDER — IBUPROFEN 600 MG PO TABS: 600.0000 mg | ORAL_TABLET | Freq: Four times a day (QID) | ORAL | 0 refills | Status: AC

## 2023-12-02 MED ORDER — NIFEDIPINE ER 60 MG PO TB24: 60.0000 mg | ORAL_TABLET | Freq: Every day | ORAL | 0 refills | Status: AC

## 2023-12-02 MED ORDER — ACETAMINOPHEN 500 MG PO TABS: 1000.0000 mg | ORAL_TABLET | Freq: Four times a day (QID) | ORAL | Status: AC

## 2023-12-02 MED ORDER — NIFEDIPINE ER OSMOTIC RELEASE 60 MG PO TB24
60.0000 mg | ORAL_TABLET | Freq: Every day | ORAL | Status: DC
  Administered 2023-12-02: 60 mg via ORAL
  Filled 2023-12-02: qty 1

## 2023-12-02 NOTE — Progress Notes (Signed)
 Subjective: POD# 3 Information for the patient's newborn:  Jasmine Blair, Jasmine Blair [[id number]]  female  Baby's Name [name] Circumcision Done  Reports feeling better and ready to go home, denies HA, visual changes, and RUQ pain Reports tolerating PO and denies N/V Pain controlled with PO meds Vaginal bleeding is normal, no clots     Objective:  VS:  Vitals:   12/01/23 1553 12/01/23 2014 12/01/23 2341 12/02/23 0423  BP: (!) 145/90 (!) 147/90 (!) 146/99 (!) 152/98  Pulse: 92 (!) 107 96 94  Resp: 18 18 16 18   Temp: 98.6 F (37 C) 98.8 F (37.1 C) 98 F (36.7 C) 98.4 F (36.9 C)  TempSrc: Oral Oral Oral Oral  SpO2: 100% 98% 99% 99%  Weight:      Height:        Intake/Output Summary (Last 24 hours) at 12/02/2023 9357 Last data filed at 12/01/2023 1900 Gross per 24 hour  Intake 120 ml  Output 2100 ml  Net -1980 ml     Recent Labs    11/30/23 0409  WBC 19.5*  HGB 11.6*  HCT 35.1*  PLT 311    Blood type: --/--/O POS (09/17 1459) Rubella:      Physical Exam:  General: alert, cooperative, and no distress CV: trace edema Resp: unlabored Abdomen: soft, non-distended, nontender Incision: clean, dry, and intact Perineum:  Uterine Fundus: firm, below umbilicus, nontender Lochia: minimal Ext: trace edema, negative for tenderness, pain, and cords   Assessment/Plan: 28 y.o.   POD# 3. [id number]                  Principal Problem:   Preeclampsia Moderate range BP persists on Procardia  30 mg, dose increased to 60 mg daily Plan for DC this afternoon   Mercer KATHEE Peal, DNP, CNM 12/02/2023, 6:42 AM

## 2023-12-02 NOTE — Lactation Note (Addendum)
 This note was copied from a baby's chart. Lactation Consultation Note  Patient Name: Jasmine Blair Unijb'd Date: 12/02/2023 Age:26 hours Reason for consult: Follow-up assessment;Late-preterm 34-36.6wks;Infant < 6lbs;RN request  P1, Baby [redacted]w[redacted]d.  Called to room due to engorgement. Mother's breasts are filling.  She has not pumped since last night. Reviewed hand expression prior to pumping. Mother pumped more milk from her L breast. Here R breast still full after pumping. Demonstrated how to gently massage breast to empty while pumping.  Breast softened.  Mother pumped 25 ml. Provided hands free pumping top. Encouraged mother to pump q 3hours for 15 min or a minimum of 8 times in 24 hours.  Mother has wearable pump at home.  Discussed smaller flange size options.  She has an appt with [facility] on 9/30.  Offered mother [facility] loaner.  Mother is deciding. Discussed using wearable and the manual pump in combo if she has difficulty emptying the breast. Mother's insurance does not cover a DEBP confirmed with [employer]. Discussed the option of a rental after discharge. Suggest ice if still full after pumping.   Maternal Data Has patient been taught Hand Expression?: Yes Does the patient have breastfeeding experience prior to this delivery?: No  Feeding Mother's Current Feeding Choice: Breast Milk and Formula   Lactation Tools Discussed/Used Tools: Pump;Flanges;Hands-free pumping top Flange Size: 18 Breast pump type: Double-Electric Breast Pump;Manual Reason for Pumping: stimulation and supplementation Pumping frequency: q 3 hours for 15 min Pumped volume: 25 mL  Interventions Interventions: DEBP;Education  Discharge Pump: Personal;Hands Free  Consult Status Consult Status: Follow-up Date: 12/03/23 Follow-up type: In-patient    Shannon Dines [facility] 12/02/2023, 11:40 AM

## 2023-12-03 ENCOUNTER — Encounter (HOSPITAL_COMMUNITY): Payer: Self-pay | Admitting: Obstetrics and Gynecology

## 2023-12-03 LAB — RPR: RPR Ser Ql: NONREACTIVE

## 2023-12-04 LAB — SURGICAL PATHOLOGY

## 2023-12-07 ENCOUNTER — Inpatient Hospital Stay (HOSPITAL_COMMUNITY)
Admission: AD | Admit: 2023-12-07 | Discharge: 2023-12-08 | Disposition: A | Discharge status: Home / Self Care | Attending: Obstetrics and Gynecology | Admitting: Obstetrics and Gynecology

## 2023-12-07 ENCOUNTER — Other Ambulatory Visit: Payer: Self-pay

## 2023-12-07 ENCOUNTER — Encounter (HOSPITAL_COMMUNITY): Payer: Self-pay | Admitting: Obstetrics and Gynecology

## 2023-12-07 DIAGNOSIS — O165 Unspecified maternal hypertension, complicating the puerperium: Secondary | ICD-10-CM | POA: Insufficient documentation

## 2023-12-07 DIAGNOSIS — Z79899 Other long term (current) drug therapy: Secondary | ICD-10-CM | POA: Insufficient documentation

## 2023-12-07 NOTE — MAU Note (Signed)
 Pt says she del on Thursday 11-29-2023 by C/S - repeat and elevated BP's - Went home on Sunday 12-02-2023. Gave Rx for BP meds (had taken PP- in hospital )  All ok- was taking  BP meds- Procardia   Today at 0900- had H/A -5/10 - took 600mg  ibuprofen  - then at 8pm- No H/A  Now no H/A , No vision problems , no epiagastric pain . She checked BP  at 130pm- 156/105,  And at 7pm-142/111 And at 1030pm- 160/109

## 2023-12-08 DIAGNOSIS — Z79899 Other long term (current) drug therapy: Secondary | ICD-10-CM | POA: Diagnosis not present

## 2023-12-08 DIAGNOSIS — O165 Unspecified maternal hypertension, complicating the puerperium: Secondary | ICD-10-CM

## 2023-12-08 LAB — COMPREHENSIVE METABOLIC PANEL WITH GFR
ALT: 23 U/L (ref 0–44)
AST: 19 U/L (ref 15–41)
Albumin: 2.8 g/dL — ABNORMAL LOW (ref 3.5–5.0)
Alkaline Phosphatase: 109 U/L (ref 38–126)
Anion gap: 9 (ref 5–15)
BUN: 15 mg/dL (ref 6–20)
CO2: 24 mmol/L (ref 22–32)
Calcium: 8.9 mg/dL (ref 8.9–10.3)
Chloride: 104 mmol/L (ref 98–111)
Creatinine, Ser: 0.77 mg/dL (ref 0.44–1.00)
GFR, Estimated: >60 mL/min (ref >60)
Glucose, Bld: 88 mg/dL (ref 70–99)
Potassium: 4.4 mmol/L (ref 3.5–5.1)
Sodium: 137 mmol/L (ref 135–145)
Total Bilirubin: 0.5 mg/dL (ref 0.0–1.2)
Total Protein: 6.5 g/dL (ref 6.5–8.1)

## 2023-12-08 LAB — CBC
HCT: 38.7 % (ref 36.0–46.0)
Hemoglobin: 12.3 g/dL (ref 12.0–15.0)
MCH: 30.1 pg (ref 26.0–34.0)
MCHC: 31.8 g/dL (ref 30.0–36.0)
MCV: 94.9 fL (ref 80.0–100.0)
Platelets: 344 K/uL (ref 150–400)
RBC: 4.08 MIL/uL (ref 3.87–5.11)
RDW: 13.9 % (ref 11.5–15.5)
WBC: 9.1 K/uL (ref 4.0–10.5)
nRBC: 0 % (ref 0.0–0.2)

## 2023-12-08 LAB — PROTEIN / CREATININE RATIO, URINE
Creatinine, Urine: 43 mg/dL
Total Protein, Urine: <6 mg/dL

## 2023-12-08 MED ORDER — FUROSEMIDE 20 MG PO TABS: 20.0000 mg | ORAL_TABLET | Freq: Every day | ORAL | 0 refills | Status: AC

## 2023-12-08 MED ORDER — POTASSIUM CHLORIDE CRYS ER 20 MEQ PO TBCR: 20.0000 meq | EXTENDED_RELEASE_TABLET | Freq: Every day | ORAL | 0 refills | Status: AC

## 2023-12-08 MED ORDER — NIFEDIPINE ER OSMOTIC RELEASE 30 MG PO TB24
30.0000 mg | ORAL_TABLET | Freq: Once | ORAL | Status: AC
  Administered 2023-12-08: 30 mg via ORAL
  Filled 2023-12-08: qty 1

## 2023-12-08 MED ORDER — NIFEDIPINE ER OSMOTIC RELEASE 30 MG PO TB24: 30.0000 mg | ORAL_TABLET | Freq: Every evening | ORAL | 11 refills | Status: AC

## 2023-12-08 NOTE — MAU Provider Note (Signed)
 History     CSN: [id number]  Arrival date and time: 12/07/23 2320 First Provider Initiated Contact with Patient   Chief Complaint  Patient presents with   Hypertension    HPI Jasmine Blair is a 28 y.o. [id number] at [redacted]w[redacted]d, 12/26/2023, by Other Basis, who presents to the Maternity Assessment Unit for elevated bp 8d PP. She endorses good compliance with her nifedipine  60mg  daily. Her mother accompanies her tonight and is concerned that the patient will have a seizure at home alone with the baby.  ROS (+) elevated bp (-) HA, visual changes, RUQ pain  Medications Prior to Admission  Medication Sig Dispense Refill Last Dose/Taking   ibuprofen  (ADVIL ) 600 MG tablet Take 1 tablet (600 mg total) by mouth every 6 (six) hours. 30 tablet 0 12/07/2023   NIFEdipine  (ADALAT  CC) 60 MG 24 hr tablet Take 1 tablet (60 mg total) by mouth daily. 30 tablet 0 12/07/2023 at  9:00 AM   oxyCODONE -acetaminophen  (PERCOCET/ROXICET) 5-325 MG tablet Take 1 tablet by mouth every 4 (four) hours as needed for severe pain (pain score 7-10) or moderate pain (pain score 4-6).   12/07/2023 at  9:00 AM   Prenatal Vit-Fe Fumarate-FA (PRENATAL MULTIVITAMIN) TABS tablet Take 1 tablet by mouth daily at 12 noon.   12/07/2023   acetaminophen  (TYLENOL ) 500 MG tablet Take 2 tablets (1,000 mg total) by mouth every 6 (six) hours.       Past Medical History:  Diagnosis Date   Medical history non-contributory     Past Surgical History:  Procedure Laterality Date   ADENOIDECTOMY     CESAREAN SECTION N/A 03/27/2018   Procedure: CESAREAN SECTION;  Surgeon: Henry Slough, MD;  Location: [facility] BIRTHING SUITES;  Service: Obstetrics;  Laterality: N/A;   CESAREAN SECTION N/A 11/29/2023   Procedure: CESAREAN DELIVERY;  Surgeon: Armond Cape, MD;  Location: [facility] LD ORS;  Service: Obstetrics;  Laterality: N/A;   TONSILLECTOMY       Allergies: No Known Allergies  ROS reviewed and pertinent positives and negatives as documented in HPI.     Physical Exam  BP (!) 133/94 (BP Location: Left Arm)   Pulse 79   Temp 98.5 F (36.9 C) (Oral)   Resp 14   Ht 5' 1 (1.549 m)   Wt 95.8 kg   LMP 03/04/2023 (Approximate)   SpO2 98%   BMI 39.89 kg/m   Gen: alert, no acute distress CV: regular rate Resp: nonlabored Extremities: pedal edema  Cervical Exam  N/A  FHT N/A  Labs --/--/O POS (09/17 1459)   Results for orders placed or performed during the hospital encounter of 12/07/23 (from the past 24 hours)  Protein / creatinine ratio, urine     Status: None   Collection Time: 12/08/23  1:02 AM  Result Value Ref Range   Creatinine, Urine 43 mg/dL   Total Protein, Urine <6 mg/dL   Protein Creatinine Ratio        0.00 - 0.15 mg/mg[Cre]  CBC     Status: None   Collection Time: 12/08/23  1:19 AM  Result Value Ref Range   WBC 9.1 4.0 - 10.5 K/uL   RBC 4.08 3.87 - 5.11 MIL/uL   Hemoglobin 12.3 12.0 - 15.0 g/dL   HCT 61.2 63.9 - 53.9 %   MCV 94.9 80.0 - 100.0 fL   MCH 30.1 26.0 - 34.0 pg   MCHC 31.8 30.0 - 36.0 g/dL   RDW 86.0 88.4 - 84.4 %   Platelets 344  150 - 400 K/uL   nRBC 0.0 0.0 - 0.2 %  Comprehensive metabolic panel with GFR     Status: Abnormal   Collection Time: 12/08/23  1:19 AM  Result Value Ref Range   Sodium 137 135 - 145 mmol/L   Potassium 4.4 3.5 - 5.1 mmol/L   Chloride 104 98 - 111 mmol/L   CO2 24 22 - 32 mmol/L   Glucose, Bld 88 70 - 99 mg/dL   BUN 15 6 - 20 mg/dL   Creatinine, Ser 9.22 0.44 - 1.00 mg/dL   Calcium  8.9 8.9 - 10.3 mg/dL   Total Protein 6.5 6.5 - 8.1 g/dL   Albumin 2.8 (L) 3.5 - 5.0 g/dL   AST 19 15 - 41 U/L   ALT 23 0 - 44 U/L   Alkaline Phosphatase 109 38 - 126 U/L   Total Bilirubin 0.5 0.0 - 1.2 mg/dL   GFR, Estimated >39 >39 mL/min   Anion gap 9 5 - 15    Imaging No results found.   Assessment and Plan  MDM Karliah Allecia Bells is a 28 y.o. [id number] at [redacted]w[redacted]d, 12/26/2023, by Other Basis, who presents to the MAU for elevated bp.  Ddx elevated blood pressure includes  but is not limited to: high risk conditions like preeclampsia or gestational hypertension; chronic hypertension; transient spurious elevated blood pressure. Of note, she was induced for preE w/SF at 36wk. Pt feeling reassured by plan for additional 30mg  nifedipine . Plan to add Lasix + K as well, this was part of her PP course. She has a PP visit scheduled in 4d, encouraged her to keep this appt and bring bp log.   1. Postpartum hypertension (Primary) s/p IOL for preE w/SF at 36+0. Increase nifedipine  from 60mg  daily to 60mg  AM + 30mg  PM. Add Lasix + K x5d to help diurese PP fluid shifts.   Allergies as of 12/08/2023   No Known Allergies      Medication List     TAKE these medications    acetaminophen  500 MG tablet Commonly known as: TYLENOL  Take 2 tablets (1,000 mg total) by mouth every 6 (six) hours.   furosemide 20 MG tablet Commonly known as: Lasix Take 1 tablet (20 mg total) by mouth daily.   ibuprofen  600 MG tablet Commonly known as: ADVIL  Take 1 tablet (600 mg total) by mouth every 6 (six) hours.   NIFEdipine  60 MG 24 hr tablet Commonly known as: ADALAT  CC Take 1 tablet (60 mg total) by mouth daily. What changed: Another medication with the same name was added. Make sure you understand how and when to take each.   NIFEdipine  30 MG 24 hr tablet Commonly known as: PROCARDIA -XL/NIFEDICAL-XL Take 1 tablet (30 mg total) by mouth every evening. Take 60mg  in the morning and 30mg  in the evening What changed: You were already taking a medication with the same name, and this prescription was added. Make sure you understand how and when to take each.   oxyCODONE -acetaminophen  5-325 MG tablet Commonly known as: PERCOCET/ROXICET Take 1 tablet by mouth every 4 (four) hours as needed for severe pain (pain score 7-10) or moderate pain (pain score 4-6).   potassium chloride SA 20 MEQ tablet Commonly known as: KLOR-CON M Take 1 tablet (20 mEq total) by mouth daily.   prenatal  multivitamin Tabs tablet Take 1 tablet by mouth daily at 12 noon.        Results pending at the time of DC: none Dispo: DC home in  stable condition with return precautions discussed and included in AVS.    Barabara Maier, DO FMOB Fellow, Faculty Practice [facility], Center for [facility]

## 2023-12-11 ENCOUNTER — Telehealth (HOSPITAL_COMMUNITY): Payer: Self-pay | Admitting: *Deleted

## 2023-12-11 NOTE — Telephone Encounter (Signed)
 12/11/2023  Name: Jasmine Blair MRN: [id number] DOB: June 14, 1995  Reason for Call:  Transition of Care Hospital Discharge Call  Contact Status: Patient Contact Status: Complete (Patient began speaking with RN, but then call dropped. RN attempted to call patient back with no answer.)  Language assistant needed:          Follow-Up Questions: Do You Have Any Concerns About Your Health As You Heal From Delivery?: No Do You Have Any Concerns About Your Infants Health?: Yes What Concerns Do You Have About Your Baby?: Patient asked if this RN could send in a prescription to [facility] for the neosure formula that infant was taking in the hospital. This RN encouraged patient to call the infant's pediatrician in order to send in the prescription to [facility] if the pediatrician still would like the infant to take neosure.  Edinburgh Postnatal Depression Scale:  In the Past 7 Days:    PHQ2-9 Depression Scale:     Discharge Follow-up: Edinburgh score requires follow up?:  (Call dropped. Called patient back with no answer. Unable to complete EPDS)  Post-discharge interventions: NA---Call dropped prior to discussion about safe sleep.  Azariah Latendresse,RN  12/11/2023 1540

## 2023-12-18 ENCOUNTER — Encounter (HOSPITAL_COMMUNITY): Admission: RE | Admit: 2023-12-18 | Source: Ambulatory Visit

## 2023-12-20 ENCOUNTER — Inpatient Hospital Stay (HOSPITAL_COMMUNITY): Admission: RE | Admit: 2023-12-20 | Source: Home / Self Care | Admitting: Obstetrics and Gynecology

## 2023-12-20 ENCOUNTER — Encounter (HOSPITAL_COMMUNITY): Admission: RE | Payer: Self-pay | Source: Home / Self Care

## 2023-12-20 SURGERY — Surgical Case
Anesthesia: Regional

## 2023-12-21 NOTE — Discharge Summary (Signed)
 OB Discharge Summary     Patient Name: Jasmine Blair DOB: 06-30-1995 MRN: [id number]  Date of admission: 11/28/2023 Delivering MD: ARMOND CAPE  Date of discharge: 9/21/202510/12/2023  Admitting diagnosis: Preeclampsia [O14.90] Intrauterine pregnancy: [redacted]w[redacted]d     Secondary diagnosis:  Principal Problem:   Preeclampsia    Discharge diagnosis: Term Pregnancy Delivered and Preeclampsia (severe)                                  Augmentation: N/A  Complications: None  Hospital course:  Sceduled C/S   28 y.o. yo G2P0202 at [redacted]w[redacted]d was admitted to the hospital 11/28/2023 for scheduled cesarean section with the following indication:Elective Repeat.Delivery details are as follows:  Membrane Rupture Time/Date: 6:41 PM,11/29/2023  Delivery Method:C-Section, Low Transverse Operative Delivery:N/A Details of operation can be found in separate operative note.  Patient had a postpartum course complicated by severe range blood pressures.  She is ambulating, tolerating a regular diet, passing flatus, and urinating well. Patient is discharged home in stable condition on  12/21/23        Newborn Data: Birth date:11/29/2023 Birth time:6:41 PM Gender:Female Living status:Living Apgars:9 ,9  Weight:2.38 kg    Physical exam  Vitals:   12/01/23 2341 12/02/23 0423 12/02/23 0740 12/02/23 1224  BP: (!) 146/99 (!) 152/98 (!) 139/90 130/75  Pulse: 96 94 87 100  Resp: 16 18 16 16   Temp: 98 F (36.7 C) 98.4 F (36.9 C) 98.4 F (36.9 C) 98 F (36.7 C)  TempSrc: Oral Oral Oral Oral  SpO2: 99% 99% 99% 100%  Weight:      Height:       General: alert, cooperative, and no distress Lochia: appropriate Uterine Fundus: firm Incision: Healing well with no significant drainage, Dressing is clean, dry, and intact DVT Evaluation: No evidence of DVT seen on physical exam. No cords or calf tenderness. No significant calf/ankle edema. Labs: Lab Results  Component Value Date   WBC 9.1 12/08/2023   HGB  12.3 12/08/2023   HCT 38.7 12/08/2023   MCV 94.9 12/08/2023   PLT 344 12/08/2023      Latest Ref Rng & Units 12/08/2023    1:19 AM  CMP  Glucose 70 - 99 mg/dL 88   BUN 6 - 20 mg/dL 15   Creatinine 9.55 - 1.00 mg/dL 9.22   Sodium 864 - 854 mmol/L 137   Potassium 3.5 - 5.1 mmol/L 4.4   Chloride 98 - 111 mmol/L 104   CO2 22 - 32 mmol/L 24   Calcium  8.9 - 10.3 mg/dL 8.9   Total Protein 6.5 - 8.1 g/dL 6.5   Total Bilirubin 0.0 - 1.2 mg/dL 0.5   Alkaline Phos 38 - 126 U/L 109   AST 15 - 41 U/L 19   ALT 0 - 44 U/L 23     Discharge instruction: per After Visit Summary and Baby and Me Booklet.  After visit meds:  Allergies as of 12/02/2023   No Known Allergies      Medication List     STOP taking these medications    Diclofenac  Sodium CR 100 MG 24 hr tablet   HYDROcodone  bit-homatropine 5-1.5 MG/5ML syrup Commonly known as: Hycodan   lidocaine  5 % Commonly known as: Lidoderm    omeprazole  20 MG capsule Commonly known as: PRILOSEC   traZODone 50 MG tablet Commonly known as: DESYREL   venlafaxine 37.5 MG tablet Commonly known as:  EFFEXOR       TAKE these medications    acetaminophen  500 MG tablet Commonly known as: TYLENOL  Take 2 tablets (1,000 mg total) by mouth every 6 (six) hours.   ibuprofen  600 MG tablet Commonly known as: ADVIL  Take 1 tablet (600 mg total) by mouth every 6 (six) hours.   NIFEdipine  60 MG 24 hr tablet Commonly known as: ADALAT  CC Take 1 tablet (60 mg total) by mouth daily.   prenatal multivitamin Tabs tablet Take 1 tablet by mouth daily at 12 noon.       ASK your doctor about these medications    oxyCODONE  5 MG immediate release tablet Commonly known as: Oxy IR/ROXICODONE  Take 1 tablet (5 mg total) by mouth every 4 (four) hours as needed for up to 3 days for severe pain (pain score 7-10) or breakthrough pain. Ask about: Should I take this medication?               Discharge Care Instructions  (From admission,  onward)           Start     Ordered   12/02/23 0000  Discharge wound care:       Comments: Take dressing off on 12/03/23, remove it sooner if it is dirty or damaged. Clean area with soap and water and pat dry. You can leave the steri strips on until they fall off or take them off gently by 12/06/23. Call the office for increased drainage, redness, pain, or warmth. Keep the incision area clean and dry at all times.   12/02/23 9357            Diet: low salt diet  Activity: Advance as tolerated. Pelvic rest for 6 weeks.   Outpatient follow up:6 weeks Follow up Appt:No future appointments. Follow up Visit:No follow-ups on file.  Postpartum contraception: undecided  Newborn Data: Live born female  Birth Weight: 5 lb 4 oz (2380 g) APGAR: 9, 9  Newborn Delivery   Birth date/time: 11/29/2023 18:41:00 Delivery type: C-Section, Low Transverse Trial of labor: No C-section categorization: Repeat     Baby Feeding: Breast and formula Disposition:home with mother   12/21/2023 Mercer KATHEE Peal, CNM
